# Patient Record
Sex: Male | Born: 1960 | Race: White | Hispanic: No | Marital: Married | Smoking: Current every day smoker
Health system: Southern US, Community
[De-identification: ages and names within clinical notes are randomized; demographics above are authoritative.]

## PROBLEM LIST (undated history)

## (undated) DIAGNOSIS — Z973 Presence of spectacles and contact lenses: Secondary | ICD-10-CM

## (undated) DIAGNOSIS — M7541 Impingement syndrome of right shoulder: Secondary | ICD-10-CM

## (undated) DIAGNOSIS — E119 Type 2 diabetes mellitus without complications: Secondary | ICD-10-CM

## (undated) DIAGNOSIS — M7501 Adhesive capsulitis of right shoulder: Secondary | ICD-10-CM

## (undated) DIAGNOSIS — E785 Hyperlipidemia, unspecified: Secondary | ICD-10-CM

## (undated) HISTORY — PX: KNEE ARTHROSCOPY: SUR90

---

## 2001-06-07 ENCOUNTER — Emergency Department (HOSPITAL_COMMUNITY): Admission: EM | Admit: 2001-06-07 | Discharge: 2001-06-07 | Payer: Self-pay | Admitting: *Deleted

## 2004-02-16 ENCOUNTER — Emergency Department (HOSPITAL_COMMUNITY): Admission: EM | Admit: 2004-02-16 | Discharge: 2004-02-17 | Payer: Self-pay | Admitting: Emergency Medicine

## 2005-04-15 HISTORY — PX: HERNIA REPAIR: SHX51

## 2005-04-15 HISTORY — PX: CHOLECYSTECTOMY: SHX55

## 2014-03-25 ENCOUNTER — Other Ambulatory Visit (HOSPITAL_COMMUNITY): Payer: Self-pay | Admitting: Family Medicine

## 2014-03-25 ENCOUNTER — Ambulatory Visit (HOSPITAL_COMMUNITY)
Admission: RE | Admit: 2014-03-25 | Discharge: 2014-03-25 | Disposition: A | Discharge status: Home / Self Care | Payer: BC Managed Care – PPO | Source: Ambulatory Visit | Attending: Family Medicine | Admitting: Family Medicine

## 2014-03-25 DIAGNOSIS — M25511 Pain in right shoulder: Secondary | ICD-10-CM | POA: Insufficient documentation

## 2014-05-24 ENCOUNTER — Encounter (HOSPITAL_BASED_OUTPATIENT_CLINIC_OR_DEPARTMENT_OTHER): Payer: Self-pay | Admitting: *Deleted

## 2014-05-24 NOTE — Progress Notes (Signed)
He will go to [facility] for [facility]-caring for wife with lymphoma Mom to bring him dos

## 2014-05-26 ENCOUNTER — Encounter (HOSPITAL_COMMUNITY)
Admission: RE | Admit: 2014-05-26 | Discharge: 2014-05-26 | Disposition: A | Discharge status: Home / Self Care | Payer: BLUE CROSS/BLUE SHIELD | Source: Ambulatory Visit | Attending: Orthopedic Surgery | Admitting: Orthopedic Surgery

## 2014-05-26 DIAGNOSIS — F17210 Nicotine dependence, cigarettes, uncomplicated: Secondary | ICD-10-CM | POA: Diagnosis not present

## 2014-05-26 DIAGNOSIS — E785 Hyperlipidemia, unspecified: Secondary | ICD-10-CM | POA: Diagnosis not present

## 2014-05-26 DIAGNOSIS — S43401A Unspecified sprain of right shoulder joint, initial encounter: Secondary | ICD-10-CM | POA: Diagnosis not present

## 2014-05-26 DIAGNOSIS — E119 Type 2 diabetes mellitus without complications: Secondary | ICD-10-CM | POA: Diagnosis not present

## 2014-05-26 DIAGNOSIS — M7541 Impingement syndrome of right shoulder: Secondary | ICD-10-CM | POA: Diagnosis present

## 2014-05-26 LAB — BASIC METABOLIC PANEL
ANION GAP: 8 (ref 5–15)
BUN: 10 mg/dL (ref 6–23)
CALCIUM: 9 mg/dL (ref 8.4–10.5)
CO2: 27 mmol/L (ref 19–32)
CREATININE: 0.72 mg/dL (ref 0.50–1.35)
Chloride: 103 mmol/L (ref 96–112)
Glucose, Bld: 138 mg/dL — ABNORMAL HIGH (ref 70–99)
Potassium: 3.7 mmol/L (ref 3.5–5.1)
SODIUM: 138 mmol/L (ref 135–145)

## 2014-05-27 ENCOUNTER — Encounter (HOSPITAL_BASED_OUTPATIENT_CLINIC_OR_DEPARTMENT_OTHER): Payer: Self-pay | Admitting: *Deleted

## 2014-05-27 ENCOUNTER — Ambulatory Visit (HOSPITAL_BASED_OUTPATIENT_CLINIC_OR_DEPARTMENT_OTHER): Payer: BLUE CROSS/BLUE SHIELD | Admitting: Anesthesiology

## 2014-05-27 ENCOUNTER — Ambulatory Visit (HOSPITAL_BASED_OUTPATIENT_CLINIC_OR_DEPARTMENT_OTHER)
Admission: RE | Admit: 2014-05-27 | Discharge: 2014-05-27 | Disposition: A | Discharge status: Home / Self Care | Payer: BLUE CROSS/BLUE SHIELD | Source: Ambulatory Visit | Attending: Orthopedic Surgery | Admitting: Orthopedic Surgery

## 2014-05-27 ENCOUNTER — Encounter (HOSPITAL_BASED_OUTPATIENT_CLINIC_OR_DEPARTMENT_OTHER)
Admission: RE | Disposition: A | Discharge status: Home / Self Care | Payer: Self-pay | Source: Ambulatory Visit | Attending: Orthopedic Surgery

## 2014-05-27 DIAGNOSIS — S43401A Unspecified sprain of right shoulder joint, initial encounter: Secondary | ICD-10-CM | POA: Diagnosis not present

## 2014-05-27 DIAGNOSIS — E785 Hyperlipidemia, unspecified: Secondary | ICD-10-CM | POA: Insufficient documentation

## 2014-05-27 DIAGNOSIS — M7541 Impingement syndrome of right shoulder: Secondary | ICD-10-CM | POA: Insufficient documentation

## 2014-05-27 DIAGNOSIS — E119 Type 2 diabetes mellitus without complications: Secondary | ICD-10-CM | POA: Insufficient documentation

## 2014-05-27 DIAGNOSIS — F17210 Nicotine dependence, cigarettes, uncomplicated: Secondary | ICD-10-CM | POA: Insufficient documentation

## 2014-05-27 DIAGNOSIS — M7501 Adhesive capsulitis of right shoulder: Secondary | ICD-10-CM

## 2014-05-27 HISTORY — DX: Type 2 diabetes mellitus without complications: E11.9

## 2014-05-27 HISTORY — DX: Impingement syndrome of right shoulder: M75.41

## 2014-05-27 HISTORY — DX: Hyperlipidemia, unspecified: E78.5

## 2014-05-27 HISTORY — DX: Presence of spectacles and contact lenses: Z97.3

## 2014-05-27 HISTORY — PX: SHOULDER ARTHROSCOPY WITH SUBACROMIAL DECOMPRESSION: SHX5684

## 2014-05-27 HISTORY — DX: Adhesive capsulitis of right shoulder: M75.01

## 2014-05-27 LAB — GLUCOSE, CAPILLARY
GLUCOSE-CAPILLARY: 153 mg/dL — AB (ref 70–99)
Glucose-Capillary: 119 mg/dL — ABNORMAL HIGH (ref 70–99)

## 2014-05-27 LAB — POCT HEMOGLOBIN-HEMACUE: HEMOGLOBIN: 15.7 g/dL (ref 13.0–17.0)

## 2014-05-27 SURGERY — SHOULDER ARTHROSCOPY WITH SUBACROMIAL DECOMPRESSION
Anesthesia: General | Site: Shoulder | Laterality: Right

## 2014-05-27 MED ORDER — SUCCINYLCHOLINE CHLORIDE 20 MG/ML IJ SOLN
INTRAMUSCULAR | Status: DC | PRN
  Administered 2014-05-27: 50 mg via INTRAVENOUS

## 2014-05-27 MED ORDER — PROPOFOL 10 MG/ML IV BOLUS
INTRAVENOUS | Status: DC | PRN
  Administered 2014-05-27: 200 mg via INTRAVENOUS

## 2014-05-27 MED ORDER — OXYCODONE HCL 5 MG PO TABS: 5.0000 mg | ORAL_TABLET | Freq: Once | ORAL | Status: DC | PRN

## 2014-05-27 MED ORDER — BUPIVACAINE-EPINEPHRINE (PF) 0.5% -1:200000 IJ SOLN
INTRAMUSCULAR | Status: DC | PRN
  Administered 2014-05-27: 25 mL via PERINEURAL

## 2014-05-27 MED ORDER — BUPIVACAINE HCL (PF) 0.5 % IJ SOLN
INTRAMUSCULAR | Status: AC
  Filled 2014-05-27: qty 30

## 2014-05-27 MED ORDER — FENTANYL CITRATE 0.05 MG/ML IJ SOLN
INTRAMUSCULAR | Status: AC
  Filled 2014-05-27: qty 2

## 2014-05-27 MED ORDER — CEFAZOLIN SODIUM-DEXTROSE 2-3 GM-% IV SOLR
INTRAVENOUS | Status: AC
  Filled 2014-05-27: qty 50

## 2014-05-27 MED ORDER — BACLOFEN 10 MG PO TABS: 10.0000 mg | ORAL_TABLET | Freq: Three times a day (TID) | ORAL | Status: DC

## 2014-05-27 MED ORDER — CEFAZOLIN SODIUM-DEXTROSE 2-3 GM-% IV SOLR
INTRAVENOUS | Status: DC | PRN
  Administered 2014-05-27: 2 g via INTRAVENOUS

## 2014-05-27 MED ORDER — HYDROMORPHONE HCL 1 MG/ML IJ SOLN: 0.2500 mg | INTRAMUSCULAR | Status: DC | PRN

## 2014-05-27 MED ORDER — MIDAZOLAM HCL 2 MG/2ML IJ SOLN
INTRAMUSCULAR | Status: AC
  Filled 2014-05-27: qty 2

## 2014-05-27 MED ORDER — DEXAMETHASONE SODIUM PHOSPHATE 4 MG/ML IJ SOLN
INTRAMUSCULAR | Status: DC | PRN
  Administered 2014-05-27: 10 mg via INTRAVENOUS

## 2014-05-27 MED ORDER — BUPIVACAINE-EPINEPHRINE (PF) 0.5% -1:200000 IJ SOLN
INTRAMUSCULAR | Status: AC
  Filled 2014-05-27: qty 30

## 2014-05-27 MED ORDER — FENTANYL CITRATE 0.05 MG/ML IJ SOLN
INTRAMUSCULAR | Status: DC | PRN
  Administered 2014-05-27 (×2): 50 ug via INTRAVENOUS

## 2014-05-27 MED ORDER — LACTATED RINGERS IV SOLN
INTRAVENOUS | Status: DC
  Administered 2014-05-27 (×2): via INTRAVENOUS

## 2014-05-27 MED ORDER — SODIUM CHLORIDE 0.9 % IR SOLN
Status: DC | PRN
  Administered 2014-05-27: 4000 mL

## 2014-05-27 MED ORDER — OXYCODONE HCL 5 MG/5ML PO SOLN: 5.0000 mg | Freq: Once | ORAL | Status: DC | PRN

## 2014-05-27 MED ORDER — FENTANYL CITRATE 0.05 MG/ML IJ SOLN
50.0000 ug | INTRAMUSCULAR | Status: DC | PRN
  Administered 2014-05-27: 100 ug via INTRAVENOUS

## 2014-05-27 MED ORDER — CEFAZOLIN SODIUM-DEXTROSE 2-3 GM-% IV SOLR: 2.0000 g | INTRAVENOUS | Status: DC

## 2014-05-27 MED ORDER — LIDOCAINE HCL (CARDIAC) 20 MG/ML IV SOLN
INTRAVENOUS | Status: DC | PRN
  Administered 2014-05-27: 50 mg via INTRAVENOUS

## 2014-05-27 MED ORDER — SENNA-DOCUSATE SODIUM 8.6-50 MG PO TABS: 2.0000 | ORAL_TABLET | Freq: Every day | ORAL | Status: DC

## 2014-05-27 MED ORDER — MIDAZOLAM HCL 2 MG/2ML IJ SOLN
1.0000 mg | INTRAMUSCULAR | Status: DC | PRN
  Administered 2014-05-27: 2 mg via INTRAVENOUS

## 2014-05-27 MED ORDER — OXYCODONE-ACETAMINOPHEN 10-325 MG PO TABS: 1.0000 | ORAL_TABLET | Freq: Four times a day (QID) | ORAL | Status: DC | PRN

## 2014-05-27 MED ORDER — ONDANSETRON HCL 4 MG/2ML IJ SOLN: 4.0000 mg | Freq: Once | INTRAMUSCULAR | Status: DC | PRN

## 2014-05-27 MED ORDER — ONDANSETRON HCL 4 MG PO TABS: 4.0000 mg | ORAL_TABLET | Freq: Three times a day (TID) | ORAL | Status: DC | PRN

## 2014-05-27 SURGICAL SUPPLY — 66 items
BLADE CUTTER GATOR 3.5 (BLADE) ×3 IMPLANT
BLADE GREAT WHITE 4.2 (BLADE) IMPLANT
BLADE GREAT WHITE 4.2MM (BLADE)
BLADE SURG 15 STRL LF DISP TIS (BLADE) IMPLANT
BLADE SURG 15 STRL SS (BLADE)
BUR OVAL 6.0 (BURR) ×3 IMPLANT
CANNULA 5.75X71 LONG (CANNULA) ×3 IMPLANT
CANNULA TWIST IN 8.25X7CM (CANNULA) IMPLANT
CLOSURE STERI-STRIP 1/2X4 (GAUZE/BANDAGES/DRESSINGS) ×1
CLSR STERI-STRIP ANTIMIC 1/2X4 (GAUZE/BANDAGES/DRESSINGS) ×2 IMPLANT
DECANTER SPIKE VIAL GLASS SM (MISCELLANEOUS) IMPLANT
DRAPE INCISE IOBAN 66X45 STRL (DRAPES) ×3 IMPLANT
DRAPE SHOULDER BEACH CHAIR (DRAPES) ×3 IMPLANT
DRAPE U 20/CS (DRAPES) ×3 IMPLANT
DRAPE U-SHAPE 47X51 STRL (DRAPES) ×3 IMPLANT
DRSG PAD ABDOMINAL 8X10 ST (GAUZE/BANDAGES/DRESSINGS) ×3 IMPLANT
DURAPREP 26ML APPLICATOR (WOUND CARE) ×3 IMPLANT
ELECT REM PT RETURN 9FT ADLT (ELECTROSURGICAL)
ELECTRODE REM PT RTRN 9FT ADLT (ELECTROSURGICAL) IMPLANT
FIBERSTICK 2 (SUTURE) IMPLANT
GAUZE SPONGE 4X4 12PLY STRL (GAUZE/BANDAGES/DRESSINGS) ×3 IMPLANT
GLOVE BIO SURGEON STRL SZ 6.5 (GLOVE) ×2 IMPLANT
GLOVE BIO SURGEON STRL SZ8 (GLOVE) ×3 IMPLANT
GLOVE BIO SURGEONS STRL SZ 6.5 (GLOVE) ×1
GLOVE BIOGEL PI IND STRL 7.0 (GLOVE) ×3 IMPLANT
GLOVE BIOGEL PI IND STRL 8 (GLOVE) ×2 IMPLANT
GLOVE BIOGEL PI INDICATOR 7.0 (GLOVE) ×6
GLOVE BIOGEL PI INDICATOR 8 (GLOVE) ×4
GLOVE ECLIPSE 6.5 STRL STRAW (GLOVE) ×3 IMPLANT
GLOVE ORTHO TXT STRL SZ7.5 (GLOVE) ×3 IMPLANT
GOWN STRL REUS W/ TWL LRG LVL3 (GOWN DISPOSABLE) ×2 IMPLANT
GOWN STRL REUS W/ TWL XL LVL3 (GOWN DISPOSABLE) ×1 IMPLANT
GOWN STRL REUS W/TWL LRG LVL3 (GOWN DISPOSABLE) ×6
GOWN STRL REUS W/TWL XL LVL3 (GOWN DISPOSABLE) ×3
IMMOBILIZER SHOULDER FOAM XLGE (SOFTGOODS) IMPLANT
KIT SHOULDER TRACTION (DRAPES) ×3 IMPLANT
LASSO 90 CVE QUICKPAS (DISPOSABLE) IMPLANT
MANIFOLD NEPTUNE II (INSTRUMENTS) ×3 IMPLANT
NEEDLE SCORPION MULTI FIRE (NEEDLE) ×3 IMPLANT
PACK ARTHROSCOPY DSU (CUSTOM PROCEDURE TRAY) ×3 IMPLANT
PACK BASIN DAY SURGERY FS (CUSTOM PROCEDURE TRAY) ×3 IMPLANT
SET ARTHROSCOPY TUBING (MISCELLANEOUS) ×3
SET ARTHROSCOPY TUBING LN (MISCELLANEOUS) ×1 IMPLANT
SHEET MEDIUM DRAPE 40X70 STRL (DRAPES) ×3 IMPLANT
SLEEVE SCD COMPRESS KNEE MED (MISCELLANEOUS) ×3 IMPLANT
SLING ARM IMMOBILIZER LRG (SOFTGOODS) IMPLANT
SLING ARM IMMOBILIZER MED (SOFTGOODS) IMPLANT
SLING ARM LRG ADULT FOAM STRAP (SOFTGOODS) ×3 IMPLANT
SLING ARM MED ADULT FOAM STRAP (SOFTGOODS) IMPLANT
SLING ARM XL FOAM STRAP (SOFTGOODS) IMPLANT
SUT FIBERWIRE #2 38 T-5 BLUE (SUTURE)
SUT MNCRL AB 4-0 PS2 18 (SUTURE) ×3 IMPLANT
SUT PDS AB 0 CT 36 (SUTURE) IMPLANT
SUT PDS AB 1 CT  36 (SUTURE)
SUT PDS AB 1 CT 36 (SUTURE) IMPLANT
SUT TIGER TAPE 7 IN WHITE (SUTURE) IMPLANT
SUT VIC AB 3-0 SH 27 (SUTURE)
SUT VIC AB 3-0 SH 27X BRD (SUTURE) IMPLANT
SUTURE FIBERWR #2 38 T-5 BLUE (SUTURE) IMPLANT
TAPE FIBER 2MM 7IN #2 BLUE (SUTURE) IMPLANT
TOWEL OR 17X24 6PK STRL BLUE (TOWEL DISPOSABLE) ×3 IMPLANT
TOWEL OR NON WOVEN STRL DISP B (DISPOSABLE) ×3 IMPLANT
TUBE CONNECTING 20'X1/4 (TUBING)
TUBE CONNECTING 20X1/4 (TUBING) IMPLANT
WAND STAR VAC 90 (SURGICAL WAND) ×3 IMPLANT
WATER STERILE IRR 1000ML POUR (IV SOLUTION) ×3 IMPLANT

## 2014-05-27 NOTE — H&P (Signed)
PREOPERATIVE H&P  Chief Complaint: other acticular cartilage disorders right shoulder impingement syndrome of right shoulder strain of muscle and tendon of the rotator cuff of right shoulder   HPI: Peter Simmons is a 54 y.o. male who presents for preoperative history and physical with a diagnosis of other acticular cartilage disorders right shoulder impingement syndrome of right shoulder strain of muscle and tendon of the rotator cuff of right shoulder . Symptoms are rated as moderate to severe, and have been worsening.  This is significantly impairing activities of daily living.  He has elected for surgical management. Failed injections and exercises and NSAIDS  Past Medical History  Diagnosis Date  . Diabetes mellitus without complication   . Hyperlipemia   . Wears glasses    Past Surgical History  Procedure Laterality Date  . Knee arthroscopy      rightx2  . Knee arthroscopy      left x1  . Cholecystectomy  2007    hernia fixed also  . Hernia repair  2007    done with lap choli   History   Social History  . Marital Status: Married    Spouse Name: N/A  . Number of Children: N/A  . Years of Education: N/A   Social History Main Topics  . Smoking status: Current Every Day Smoker -- 0.50 packs/day  . Smokeless tobacco: Not on file  . Alcohol Use: No  . Drug Use: No  . Sexual Activity: Not on file   Other Topics Concern  . None   Social History Narrative   History reviewed. No pertinent family history. No Known Allergies Prior to Admission medications   Medication Sig Start Date End Date Taking? Authorizing Provider  simvastatin (ZOCOR) 20 MG tablet Take 20 mg by mouth daily.   Yes Historical Provider, MD  SitaGLIPtin-MetFORMIN HCl 50-1000 MG TB24 Take by mouth.   Yes Historical Provider, MD     Positive ROS: All other systems have been reviewed and were otherwise negative with the exception of those mentioned in the HPI and as above.  Physical Exam: General:  Alert, no acute distress Cardiovascular: No pedal edema Respiratory: No cyanosis, no use of accessory musculature GI: No organomegaly, abdomen is soft and non-tender Skin: No lesions in the area of chief complaint Neurologic: Sensation intact distally Psychiatric: Patient is competent for consent with normal mood and affect Lymphatic: No axillary or cervical lymphadenopathy  MUSCULOSKELETAL: right shoulder positive impingement signs, 0-160 degrees of motion, cuff strength fair  Assessment: other acticular cartilage disorders right shoulder impingement syndrome of right shoulder strain of muscle and tendon of the rotator cuff of right shoulder   Plan: Plan for Procedure(s): RIGHT SHOULDER ARTHROSCOPY DEBRIDEMENT ACROMIOPLASTY POSSIBLE ROTATOR CUFF REPAIR  The risks benefits and alternatives were discussed with the patient including but not limited to the risks of nonoperative treatment, versus surgical intervention including infection, bleeding, nerve injury,  blood clots, cardiopulmonary complications, morbidity, mortality, among others, and they were willing to proceed.   Eulas PostLANDAU,Tieshia Rettinger P, MD Cell [phone]   05/27/2014 1:35 PM

## 2014-05-27 NOTE — Transfer of Care (Signed)
Immediate Anesthesia Transfer of Care Note  Patient: Peter Simmons  Procedure(s) Performed: Procedure(s): RIGHT SHOULDER ARTHROSCOPY WITH DEBRIDEMENT, ACROMIOPLASTY  (Right)  Patient Location: PACU  Anesthesia Type:GA combined with regional for post-op pain  Level of Consciousness: sedated and patient cooperative  Airway & Oxygen Therapy: Patient Spontanous Breathing and Patient connected to face mask oxygen  Post-op Assessment: Report given to RN and Post -op Vital signs reviewed and stable  Post vital signs: Reviewed and stable  Last Vitals:  Filed Vitals:   05/27/14 1135  BP: 113/66  Pulse: 82  Temp:   Resp: 14    Complications: No apparent anesthesia complications

## 2014-05-27 NOTE — Op Note (Signed)
05/27/2014  2:30 PM  PATIENT:  Peter Simmons    PRE-OPERATIVE DIAGNOSIS:  Right shoulder impingement syndrome, possible rotator cuff tear versus biceps tendinosis  POST-OPERATIVE DIAGNOSIS:  Right shoulder impingement syndrome with superior labral fraying, mild adhesive capsulitis   PROCEDURE:  Right shoulder arthroscopy with debridement of labrum, acromioplasty, and manipulation under anesthesia  SURGEON:  Eulas PostLANDAU,Meggin Ola P, MD  PHYSICIAN ASSISTANT: None  ANESTHESIA:   General  PREOPERATIVE INDICATIONS:  Peter FlattenBilly R Tolbert is a  54 y.o. male who had about 2 years worth of right shoulder pain, who failed conservative measures and elected for surgical management.    The risks benefits and alternatives were discussed with the patient preoperatively including but not limited to the risks of infection, bleeding, nerve injury, cardiopulmonary complications, the need for revision surgery, among others, and the patient was willing to proceed.  OPERATIVE IMPLANTS: None  OPERATIVE FINDINGS: The shoulder lacked about 20 of motion and overhead forward flexion during examination under anesthesia and manipulation actually yielded substantial lysis of adhesions. The capsule from the articular side however did not have significant injection or redness. The biceps tendon was totally pristine, the superior labrum did have some degenerative fraying, the glenoid and humeral head articular cartilage was in good condition. The subscapularis was pristine. The middle glenohumeral ligament did have some fraying which I debrided. The labrum was intact posteriorly and anteroinferiorly.  From the bursal side there was moderate CA ligament fraying, the tendons of the supraspinatus were all in good condition, although there was some slight fraying but nothing terrible. There was moderate subacromial spurring. The before meals joint was pristine and was not violated.  OPERATIVE PROCEDURE: The patient was brought to the  operating room and placed in the supine position. Gen. anesthesia was administered. IV antibiotics were given. The right upper extremity was examined, and did find that there was some stiffness, and manipulation under anesthesia did yield significant lysis of adhesions.  The right upper extremity was then prepped and draped in usual sterile fashion and he was in a semilateral decubitus position with all bony prominences padded. Time out was performed. Diagnostic arthroscopy was carried out with the above-named findings and I used the arthroscopic shaver to debride the labrum, as well as a portion of the middle glenohumeral ligament. The biceps was evaluated and found to be normal and so I did not perform a tenotomy.  I then went to the subacromial space and performed a complete bursectomy, followed by CA ligament release and acromioplasty. I confirmed acromioplasty as well as integrity of the supra spinatus tendon from multiple portals.  The instruments were removed, the portals closed with Monocryl followed by Steri-Strips and sterile gauze. He was awakened and returned to the PACU in stable and satisfactory condition. There were no complications and he tolerated the procedure well.

## 2014-05-27 NOTE — Anesthesia Procedure Notes (Addendum)
Anesthesia Regional Block:  Interscalene brachial plexus block  Pre-Anesthetic Checklist: ,, timeout performed, Correct Patient, Correct Site, Correct Laterality, Correct Procedure, Correct Position, site marked, Risks and benefits discussed,  Surgical consent,  Pre-op evaluation,  At surgeon's request and post-op pain management  Laterality: Right and Upper  Prep: chloraprep       Needles:  Injection technique: Single-shot  Needle Type: Echogenic Needle     Needle Length: 5cm 5 cm Needle Gauge: 21 and 21 G    Additional Needles:  Procedures: ultrasound guided (picture in chart) Interscalene brachial plexus block Narrative:  Start time: 05/27/2014 11:34 AM End time: 05/27/2014 11:39 AM Injection made incrementally with aspirations every 5 mL.  Performed by: Personally  Anesthesiologist: CREWS, DAVID A   Procedure Name: Intubation Date/Time: 05/27/2014 1:37 PM Performed by: Harriman DesanctisLINKA, Treniece Holsclaw L Pre-anesthesia Checklist: Patient identified, Emergency Drugs available, Suction available, Patient being monitored and Timeout performed Patient Re-evaluated:Patient Re-evaluated prior to inductionOxygen Delivery Method: Circle System Utilized Preoxygenation: Pre-oxygenation with 100% oxygen Intubation Type: IV induction Ventilation: Mask ventilation without difficulty Laryngoscope Size: Miller and 3 Grade View: Grade III Tube type: Oral Tube size: 8.0 mm Number of attempts: 1 Airway Equipment and Method: Stylet and Oral airway Placement Confirmation: ETT inserted through vocal cords under direct vision,  positive ETCO2 and breath sounds checked- equal and bilateral Secured at: 21 cm Tube secured with: Tape Dental Injury: Teeth and Oropharynx as per pre-operative assessment

## 2014-05-27 NOTE — Progress Notes (Signed)
Assisted Dr. Crews with right, ultrasound guided, interscalene  block. Side rails up, monitors on throughout procedure. See vital signs in flow sheet. Tolerated Procedure well. 

## 2014-05-27 NOTE — Anesthesia Preprocedure Evaluation (Signed)
Anesthesia Evaluation  Patient identified by MRN, date of birth, ID band Patient awake    Reviewed: Allergy & Precautions, NPO status , Patient's Chart, lab work & pertinent test results  Airway Mallampati: I  TM Distance: >3 FB Neck ROM: Full    Dental  (+) Teeth Intact, Dental Advisory Given   Pulmonary Current Smoker,  breath sounds clear to auscultation        Cardiovascular Rhythm:Regular Rate:Normal     Neuro/Psych    GI/Hepatic   Endo/Other  diabetes, Well Controlled, Type 2, Oral Hypoglycemic Agents  Renal/GU      Musculoskeletal   Abdominal   Peds  Hematology   Anesthesia Other Findings   Reproductive/Obstetrics                             Anesthesia Physical Anesthesia Plan  ASA: II  Anesthesia Plan: General   Post-op Pain Management: MAC Combined w/ Regional for Post-op pain   Induction: Intravenous  Airway Management Planned: Oral ETT  Additional Equipment:   Intra-op Plan:   Post-operative Plan: Extubation in OR  Informed Consent: I have reviewed the patients History and Physical, chart, labs and discussed the procedure including the risks, benefits and alternatives for the proposed anesthesia with the patient or authorized representative who has indicated his/her understanding and acceptance.   Dental advisory given  Plan Discussed with: CRNA, Anesthesiologist and Surgeon  Anesthesia Plan Comments:         Anesthesia Quick Evaluation

## 2014-05-27 NOTE — Anesthesia Postprocedure Evaluation (Signed)
Anesthesia Post Note  Patient: Peter Simmons  Procedure(s) Performed: Procedure(s) (LRB): RIGHT SHOULDER ARTHROSCOPY WITH DEBRIDEMENT, ACROMIOPLASTY  (Right)  Anesthesia type: general  Patient location: PACU  Post pain: Pain level controlled  Post assessment: Patient's Cardiovascular Status Stable  Last Vitals:  Filed Vitals:   05/27/14 1555  BP: 112/67  Pulse: 66  Temp: 36.6 C  Resp: 18    Post vital signs: Reviewed and stable  Level of consciousness: sedated  Complications: No apparent anesthesia complications

## 2014-05-27 NOTE — Discharge Instructions (Signed)
Diet: As you were doing prior to hospitalization   Shower:  May shower but keep the wounds dry, use an occlusive plastic wrap, NO SOAKING IN TUB.  If the bandage gets wet, change with a clean dry gauze.  Dressing:  You may change your dressing 3-5 days after surgery.  Then change the dressing daily with sterile gauze dressing.    There are sticky tapes (steri-strips) on your wounds and all the stitches are absorbable.  Leave the steri-strips in place when changing your dressings, they will peel off with time, usually 2-3 weeks.  Activity:  Increase activity slowly as tolerated, but follow the weight bearing instructions below.  No lifting or driving for 6 weeks.  Weight Bearing:   Ok to use arm and remove sling when comfortable.    To prevent constipation: you may use a stool softener such as -  Colace (over the counter) 100 mg by mouth twice a day  Drink plenty of fluids (prune juice may be helpful) and high fiber foods Miralax (over the counter) for constipation as needed.    Itching:  If you experience itching with your medications, try taking only a single pain pill, or even half a pain pill at a time.  You may take up to 10 pain pills per day, and you can also use benadryl over the counter for itching or also to help with sleep.   Precautions:  If you experience chest pain or shortness of breath - call 911 immediately for transfer to the hospital emergency department!!  If you develop a fever greater that 101 F, purulent drainage from wound, increased redness or drainage from wound, or calf pain -- Call the office at [phone]                                                Follow- Up Appointment:  Please call for an appointment to be [place] 2 [place] [phone]      Post Anesthesia Home Care Instructions  Activity: Get plenty of rest for the remainder of the day. A responsible adult should stay with you for 24 hours following the procedure.  For the next 24  hours, DO NOT: -Drive a car -Advertising copywriterperate machinery -Drink alcoholic beverages -Take any medication unless instructed by your physician -Make any legal decisions or sign important papers.  Meals: Start with liquid foods such as gelatin or soup. Progress to regular foods as tolerated. Avoid greasy, spicy, heavy foods. If nausea and/or vomiting occur, drink only clear liquids until the nausea and/or vomiting subsides. Call your physician if vomiting continues.  Special Instructions/Symptoms: Your throat may feel dry or sore from the anesthesia or the breathing tube placed in your throat during surgery. If this causes discomfort, gargle with warm salt water. The discomfort should disappear within 24 hours.   Regional Anesthesia Blocks  1. Numbness or the inability to move the "blocked" extremity may last from 3-48 hours after placement. The length of time depends on the medication injected and your individual response to the medication. If the numbness is not going away after 48 hours, call your surgeon.  2. The extremity that is blocked will need to be protected until the numbness is gone and the  Strength has returned. Because you cannot feel it, you will need to take extra care to avoid injury. Because it  may be weak, you may have difficulty moving it or using it. You may not know what position it is in without looking at it while the block is in effect.  3. For blocks in the legs and feet, returning to weight bearing and walking needs to be done carefully. You will need to wait until the numbness is entirely gone and the strength has returned. You should be able to move your leg and foot normally before you try and bear weight or walk. You will need someone to be with you when you first try to ensure you do not fall and possibly risk injury.  4. Bruising and tenderness at the needle site are common side effects and will resolve in a few days.  5. Persistent numbness or new problems with movement  should be communicated to the surgeon or the [facility] [phone] [facility] [phone]).

## 2014-05-31 ENCOUNTER — Encounter (HOSPITAL_BASED_OUTPATIENT_CLINIC_OR_DEPARTMENT_OTHER): Payer: Self-pay | Admitting: Orthopedic Surgery

## 2015-01-20 ENCOUNTER — Encounter: Payer: Self-pay | Admitting: "Endocrinology

## 2015-01-20 ENCOUNTER — Ambulatory Visit (INDEPENDENT_AMBULATORY_CARE_PROVIDER_SITE_OTHER): Payer: BLUE CROSS/BLUE SHIELD | Admitting: "Endocrinology

## 2015-01-20 VITALS — BP 125/75 | HR 75 | Ht 69.0 in | Wt 170.0 lb

## 2015-01-20 DIAGNOSIS — E1165 Type 2 diabetes mellitus with hyperglycemia: Secondary | ICD-10-CM | POA: Diagnosis not present

## 2015-01-20 DIAGNOSIS — Z716 Tobacco abuse counseling: Secondary | ICD-10-CM | POA: Diagnosis not present

## 2015-01-20 DIAGNOSIS — E785 Hyperlipidemia, unspecified: Secondary | ICD-10-CM | POA: Insufficient documentation

## 2015-01-20 DIAGNOSIS — Z794 Long term (current) use of insulin: Secondary | ICD-10-CM

## 2015-01-20 DIAGNOSIS — IMO0001 Reserved for inherently not codable concepts without codable children: Secondary | ICD-10-CM

## 2015-01-20 NOTE — Progress Notes (Signed)
Subjective:    Patient ID: Peter Simmons, male    DOB: 1961-01-24,    Past Medical History  Diagnosis Date  . Diabetes mellitus without complication (HCC)   . Hyperlipemia   . Wears glasses   . Impingement syndrome of right shoulder 05/27/2014  . 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 Knee arthroscopy      rightx2  . Knee arthroscopy      left x1  . Cholecystectomy  2007    hernia fixed also  . Hernia repair  2007    done with lap choli  . Shoulder arthroscopy with subacromial decompression Right 05/27/2014    Procedure: RIGHT SHOULDER ARTHROSCOPY WITH DEBRIDEMENT, ACROMIOPLASTY ;  Surgeon: Eulas Post, MD;  Location: [facility];  Service: Orthopedics;  Laterality: Right;   Social History   Social History  . Marital Status: Married    Spouse Name: N/A  . Number of Children: N/A  . Years of Education: N/A   Social History Main Topics  . Smoking status: Current Every Day Smoker -- 0.50 packs/day  . Smokeless tobacco: None  . Alcohol Use: No  . Drug Use: No  . Sexual Activity: Not Asked   Other Topics Concern  . None   Social History Narrative   Outpatient Encounter Prescriptions as of 01/20/2015  Medication Sig  . baclofen (LIORESAL) 10 MG tablet Take 1 tablet (10 mg total) by mouth 3 (three) times daily. As needed for muscle spasm  . ondansetron (ZOFRAN) 4 MG tablet Take 1 tablet (4 mg total) by mouth every 8 (eight) hours as needed for nausea or vomiting.  Marland Kitchen oxyCODONE-acetaminophen (PERCOCET) 10-325 MG per tablet Take 1-2 tablets by mouth every 6 (six) hours as needed for pain. MAXIMUM TOTAL ACETAMINOPHEN DOSE IS 4000 MG PER DAY  . sennosides-docusate sodium (SENOKOT-S) 8.6-50 MG tablet Take 2 tablets by mouth daily.  . simvastatin (ZOCOR) 20 MG tablet Take 20 mg by mouth daily.  . SitaGLIPtin-MetFORMIN HCl 50-1000 MG TB24 Take by mouth.   No facility-administered encounter  medications on file as of 01/20/2015.   ALLERGIES: No Known Allergies VACCINATION STATUS:  There is no immunization history on file for this patient.  HPI  54- yr- old male patient with medical problems as follows. Patient is here to f/u for uncontrolled type 2 DM. Pt is on basal / bolus insulin and Janumet 50/1000mg  ER once a day. He came with improved A1c of 7.3% from  12.5% . He feels stronger, he documented near target glucose profile. He didn't have to inject NovoLog at least a third of the times.  He denies polyuria, polydipsia.  Patient was diagnosed with type 2 DM approx. 9 yrs ago, likely due to ETOH abuse. Has been overweight several years ago, and has abused ETOH. Patient denies history of CAD, CVA, CKD, Neuropathy, and Retinopathy. Patient is an active smoker, does not participate in a regular exercise program. Patient is willing to remain committed for change in life style.  Review of Systems  Constitutional: Negative for fatigue and unexpected weight change.  HENT: Negative for dental problem, mouth sores and trouble swallowing.   Eyes: Negative for visual disturbance.  Respiratory: Negative for cough, choking, chest tightness, shortness of breath and wheezing.   Cardiovascular: Negative for chest pain, palpitations and leg swelling.  Gastrointestinal: Negative for nausea, vomiting, abdominal pain, diarrhea, constipation and abdominal distention.  Endocrine: Negative for polydipsia,  polyphagia and polyuria.  Genitourinary: Negative for dysuria, urgency, hematuria and flank pain.  Musculoskeletal: Negative for myalgias, back pain, gait problem and neck pain.  Skin: Negative for pallor, rash and wound.  Neurological: Negative for seizures, syncope, weakness, numbness and headaches.  Psychiatric/Behavioral: Negative.  Negative for confusion and dysphoric mood.    Objective:    BP 125/75 mmHg  Pulse 75  Ht  (1.753 m)  Wt 170 lb (77.111 kg)  BMI 25.09 kg/m2  SpO2  98%  Wt Readings from Last 3 Encounters:  01/20/15 170 lb (77.111 kg)  05/27/14 164 lb (74.39 kg)    Physical Exam  Constitutional: He is oriented to person, place, and time. He appears well-developed and well-nourished. He is cooperative. No distress.  HENT:  Head: Normocephalic and atraumatic.  Eyes: EOM are normal.  Neck: Normal range of motion. Neck supple. No tracheal deviation present. No thyromegaly present.  Cardiovascular: Normal rate, S1 normal, S2 normal and normal heart sounds.  Exam reveals no gallop.   No murmur heard. Pulses:      Dorsalis pedis pulses are 1+ on the right side, and 1+ on the left side.       Posterior tibial pulses are 1+ on the right side, and 1+ on the left side.  Pulmonary/Chest: Breath sounds normal. No respiratory distress. He has no wheezes.  Abdominal: Soft. Bowel sounds are normal. He exhibits no distension. There is no tenderness. There is no guarding and no CVA tenderness.  Musculoskeletal: He exhibits no edema.       Right shoulder: He exhibits no swelling and no deformity.  Neurological: He is alert and oriented to person, place, and time. He has normal strength and normal reflexes. No cranial nerve deficit or sensory deficit. Gait normal.  Skin: Skin is warm and dry. No rash noted. No cyanosis. Nails show no clubbing.  Psychiatric: He has a normal mood and affect. His speech is normal and behavior is normal. Judgment and thought content normal. Cognition and memory are normal.    Results for orders placed or performed during the hospital encounter of 05/27/14  Glucose, capillary  Result Value Ref Range   Glucose-Capillary 119 (H) 70 - 99 mg/dL  Glucose, capillary  Result Value Ref Range   Glucose-Capillary 153 (H) 70 - 99 mg/dL  Hemoglobin-hemacue, POC  Result Value Ref Range   Hemoglobin 15.7 13.0 - 17.0 g/dL   Complete Blood Count (Most recent): Lab Results  Component Value Date   HGB 15.7 05/27/2014   Chemistry (most  recent): Lab Results  Component Value Date   NA 138 05/26/2014   K 3.7 05/26/2014   CL 103 05/26/2014   CO2 27 05/26/2014   BUN 10 05/26/2014   CREATININE 0.72 05/26/2014   On 01/14/2015 his A1c was 7.3%.    Assessment & Plan:   1. Uncontrolled type 2 diabetes mellitus without complication, with long-term current use of insulin (HCC)  Patient came with improved glucose profile, and  recent A1c of 7.3 % improved from 12.5%.  Glucose logs and insulin administration records pertaining to this visit,  to be scanned into patient's records.  Recent labs reviewed. - Patient remains at a high risk for more acute and chronic complications of diabetes which include CAD, CVA, CKD, retinopathy, and neuropathy. These are all discussed in detail with the patient.  - I have re-counseled the patient on diet management and by adopting a carbohydrate restricted / protein rich  Diet. - Patient is advised  to stick to a routine mealtimes to eat 3 meals  a day and avoid unnecessary snacks ( to snack only to correct hypoglycemia).  - Suggestion is made for patient to avoid simple carbohydrates   from their diet including Cakes , Desserts, Ice Cream,  Soda (  diet and regular) , Sweet Tea , Candies,  Chips, Cookies, Artificial Sweeteners,   and "Sugar-free" Products .  This will help patient to have stable blood glucose profile and potentially avoid unintended  Weight gain.  - The patient  has been  scheduled with Norm Salt, RDN, CDE for individualized DM education. - I have approached patient with the following individualized plan to manage diabetes and patient agrees.  - I will proceed with basal insulinToujeo  20 units QHS, continue  prandial insulin for now associated with strict monitoring of glucose  Q a.m. -Patient is encouraged to call clinic for blood glucose levels less than 70 or above 300 mg /dl. - I will continue Janumet 50/1000 mg extended release by mouth daily, therapeutically suitable  for patient..  - Patient will be considered for incretin therapy as appropriate next visit. - Patient specific target  for A1c; LDL, HDL, Triglycerides, and  Waist Circumference were discussed in detail.  2) BP/HTN: Controlled. Continue current medications.  3) Lipids/HPL: Recent LDL at above target levels. simvastatin 20 mg by mouth daily at bedtime, continue  4)  Weight/Diet: CDE consult in progress, exercise, and carbohydrates information provided.  5) Chronic Care/Health Maintenance:  -Patient is on  Statin medications and encouraged to continue to follow up with Ophthalmology, Podiatrist at least yearly or according to recommendations, and advised to quit stay away from smoking. I have recommended yearly flu vaccine and pneumonia vaccination at least every 5 years; moderate intensity exercise for up to 150 minutes weekly; and  sleep for at least 7 hours a day.  I advised patient to maintain close follow up with their PCP for primary care needs.  Patient is asked to bring meter and  blood glucose logs during their next visit.   Follow up plan: Return in about 3 months (around 04/22/2015) for diabetes, high cholesterol, follow up with pre-visit labs, meter, and logs.  Marquis Lunch, MD Phone: [phone]  Fax: [phone]   01/20/2015, 4:12 PM

## 2015-01-20 NOTE — Patient Instructions (Signed)
Advice for weight management -For most of [place] the best way to control weight is by diet management. Generally speaking, diet management means restricting carbohydrate consumption to minimum possible (and to unprocessed or minimally processed complex starch) and increasing protein intake (animal or plant source), fruits, and vegetables.  -Sticking to a routine mealtime to eat 3 meals a day and avoiding unnecessary snacks is shown to have a big role in weight control.  -It is better to avoid simple carbohydrates including: Cakes, Desserts, Ice Cream, Soda (diet and regular), Sweet Tea, Candies, Chips, Cookies, Artificial Sweeteners, and "Sugar-free" Products.   -Exercise: 30 minutes a day 3-4 days a week, or 150 minutes a week. Combine stretch, strength, and aerobic activities. You may seek evaluation by your heart doctor prior to initiating exercise if you have high risk for heart disease.

## 2015-03-03 ENCOUNTER — Other Ambulatory Visit: Payer: Self-pay

## 2015-03-03 MED ORDER — INSULIN GLARGINE 300 UNIT/ML ~~LOC~~ SOPN: 30.0000 [IU] | PEN_INJECTOR | Freq: Every day | SUBCUTANEOUS | Status: DC

## 2015-03-29 ENCOUNTER — Encounter (INDEPENDENT_AMBULATORY_CARE_PROVIDER_SITE_OTHER): Payer: Self-pay | Admitting: *Deleted

## 2015-04-19 ENCOUNTER — Other Ambulatory Visit: Payer: Self-pay | Admitting: "Endocrinology

## 2015-04-19 LAB — BASIC METABOLIC PANEL
BUN: 14 mg/dL (ref 7–25)
CALCIUM: 9.4 mg/dL (ref 8.6–10.3)
CO2: 26 mmol/L (ref 20–31)
CREATININE: 0.69 mg/dL — AB (ref 0.70–1.33)
Chloride: 103 mmol/L (ref 98–110)
Glucose, Bld: 117 mg/dL — ABNORMAL HIGH (ref 65–99)
Potassium: 4.3 mmol/L (ref 3.5–5.3)
SODIUM: 139 mmol/L (ref 135–146)

## 2015-04-20 LAB — HEMOGLOBIN A1C
HEMOGLOBIN A1C: 7.2 % — AB (ref <5.7)
MEAN PLASMA GLUCOSE: 160 mg/dL — AB (ref <117)

## 2015-04-28 ENCOUNTER — Encounter: Payer: Self-pay | Admitting: "Endocrinology

## 2015-04-28 ENCOUNTER — Ambulatory Visit (INDEPENDENT_AMBULATORY_CARE_PROVIDER_SITE_OTHER): Payer: BLUE CROSS/BLUE SHIELD | Admitting: "Endocrinology

## 2015-04-28 VITALS — BP 134/80 | HR 87 | Ht 69.0 in | Wt 176.0 lb

## 2015-04-28 DIAGNOSIS — Z794 Long term (current) use of insulin: Secondary | ICD-10-CM | POA: Diagnosis not present

## 2015-04-28 DIAGNOSIS — E785 Hyperlipidemia, unspecified: Secondary | ICD-10-CM | POA: Diagnosis not present

## 2015-04-28 DIAGNOSIS — E1165 Type 2 diabetes mellitus with hyperglycemia: Secondary | ICD-10-CM | POA: Diagnosis not present

## 2015-04-28 DIAGNOSIS — IMO0001 Reserved for inherently not codable concepts without codable children: Secondary | ICD-10-CM

## 2015-04-28 NOTE — Progress Notes (Signed)
Subjective:    Patient ID: Peter Simmons, male    DOB: Sep 29, 1960,    Past Medical History  Diagnosis Date  . Diabetes mellitus without complication (HCC)   . Hyperlipemia   . Wears glasses   . Impingement syndrome of right shoulder 05/27/2014  . 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 Knee arthroscopy      rightx2  . Knee arthroscopy      left x1  . Cholecystectomy  2007    hernia fixed also  . Hernia repair  2007    done with lap choli  . Shoulder arthroscopy with subacromial decompression Right 05/27/2014    Procedure: RIGHT SHOULDER ARTHROSCOPY WITH DEBRIDEMENT, ACROMIOPLASTY ;  Surgeon: Eulas Post, MD;  Location: [facility];  Service: Orthopedics;  Laterality: Right;   Social History   Social History  . Marital Status: Married    Spouse Name: N/A  . Number of Children: N/A  . Years of Education: N/A   Social History Main Topics  . Smoking status: Current Every Day Smoker -- 0.50 packs/day  . Smokeless tobacco: None  . Alcohol Use: No  . Drug Use: No  . Sexual Activity: Not Asked   Other Topics Concern  . None   Social History Narrative   Outpatient Encounter Prescriptions as of 04/28/2015  Medication Sig  . Insulin Glargine (TOUJEO SOLOSTAR) 300 UNIT/ML SOPN Inject 30 Units into the skin at bedtime. (Patient taking differently: Inject 20 Units into the skin at bedtime. )  . SitaGLIPtin-MetFORMIN HCl 50-1000 MG TB24 Take by mouth.  . baclofen (LIORESAL) 10 MG tablet Take 1 tablet (10 mg total) by mouth 3 (three) times daily. As needed for muscle spasm  . ondansetron (ZOFRAN) 4 MG tablet Take 1 tablet (4 mg total) by mouth every 8 (eight) hours as needed for nausea or vomiting.  Marland Kitchen oxyCODONE-acetaminophen (PERCOCET) 10-325 MG per tablet Take 1-2 tablets by mouth every 6 (six) hours as needed for pain. MAXIMUM TOTAL ACETAMINOPHEN DOSE IS 4000 MG PER DAY  . sennosides-docusate  sodium (SENOKOT-S) 8.6-50 MG tablet Take 2 tablets by mouth daily.  . simvastatin (ZOCOR) 20 MG tablet Take 20 mg by mouth daily.   No facility-administered encounter medications on file as of 04/28/2015.   ALLERGIES: No Known Allergies VACCINATION STATUS:  There is no immunization history on file for this patient.  HPI  55- yr- old male patient with medical problems as follows. Patient is here to f/u for uncontrolled type 2 DM. Pt is on basal insulin and Janumet 50/1000mg  ER once a day. He came with improved A1c of 7.2% from  12.5% . He feels stronger, he documented near target fasting glucose profile.  He denies polyuria, polydipsia.  Patient was diagnosed with type 2 DM approx. 10 yrs ago, likely due to ETOH abuse. Has been overweight several years ago, and has abused ETOH. Patient denies history of CAD, CVA, CKD, Neuropathy, and Retinopathy. Patient is an active smoker, does not participate in a regular exercise program. Patient is willing to remain committed for change in life style.  Review of Systems  Constitutional: Negative for fatigue and unexpected weight change.  HENT: Negative for dental problem, mouth sores and trouble swallowing.   Eyes: Negative for visual disturbance.  Respiratory: Negative for cough, choking, chest tightness, shortness of breath and wheezing.   Cardiovascular: Negative for chest pain, palpitations and leg swelling.  Gastrointestinal: Negative  for nausea, vomiting, abdominal pain, diarrhea, constipation and abdominal distention.  Endocrine: Negative for polydipsia, polyphagia and polyuria.  Genitourinary: Negative for dysuria, urgency, hematuria and flank pain.  Musculoskeletal: Negative for myalgias, back pain, gait problem and neck pain.  Skin: Negative for pallor, rash and wound.  Neurological: Negative for seizures, syncope, weakness, numbness and headaches.  Psychiatric/Behavioral: Negative.  Negative for confusion and dysphoric mood.     Objective:    BP 134/80 mmHg  Pulse 87  Ht 5\' 9"  (1.753 m)  Wt 176 lb (79.833 kg)  BMI 25.98 kg/m2  SpO2 99%  Wt Readings from Last 3 Encounters:  04/28/15 176 lb (79.833 kg)  01/20/15 170 lb (77.111 kg)  05/27/14 164 lb (74.39 kg)    Physical Exam  Constitutional: He is oriented to person, place, and time. He appears well-developed and well-nourished. He is cooperative. No distress.  HENT:  Head: Normocephalic and atraumatic.  Eyes: EOM are normal.  Neck: Normal range of motion. Neck supple. No tracheal deviation present. No thyromegaly present.  Cardiovascular: Normal rate, S1 normal, S2 normal and normal heart sounds.  Exam reveals no gallop.   No murmur heard. Pulses:      Dorsalis pedis pulses are 1+ on the right side, and 1+ on the left side.       Posterior tibial pulses are 1+ on the right side, and 1+ on the left side.  Pulmonary/Chest: Breath sounds normal. No respiratory distress. He has no wheezes.  Abdominal: Soft. Bowel sounds are normal. He exhibits no distension. There is no tenderness. There is no guarding and no CVA tenderness.  Musculoskeletal: He exhibits no edema.       Right shoulder: He exhibits no swelling and no deformity.  Neurological: He is alert and oriented to person, place, and time. He has normal strength and normal reflexes. No cranial nerve deficit or sensory deficit. Gait normal.  Skin: Skin is warm and dry. No rash noted. No cyanosis. Nails show no clubbing.  Psychiatric: He has a normal mood and affect. His speech is normal and behavior is normal. Judgment and thought content normal. Cognition and memory are normal.    Results for orders placed or performed in visit on 04/19/15  Basic metabolic panel  Result Value Ref Range   Sodium 139 135 - 146 mmol/L   Potassium 4.3 3.5 - 5.3 mmol/L   Chloride 103 98 - 110 mmol/L   CO2 26 20 - 31 mmol/L   Glucose, Bld 117 (H) 65 - 99 mg/dL   BUN 14 7 - 25 mg/dL   Creat 1.910.69 (L) 4.780.70 - 1.33 mg/dL    Calcium 9.4 8.6 - 29.510.3 mg/dL  Hemoglobin A2ZA1c  Result Value Ref Range   Hgb A1c MFr Bld 7.2 (H) <5.7 %   Mean Plasma Glucose 160 (H) <117 mg/dL   Complete Blood Count (Most recent): Lab Results  Component Value Date   HGB 15.7 05/27/2014   Chemistry (most recent): Lab Results  Component Value Date   NA 139 04/19/2015   K 4.3 04/19/2015   CL 103 04/19/2015   CO2 26 04/19/2015   BUN 14 04/19/2015   CREATININE 0.69* 04/19/2015     Assessment & Plan:   1. Uncontrolled type 2 diabetes mellitus without complication, with long-term current use of insulin (HCC)  Patient came with improved glucose profile, and  recent A1c of 7.2 % improved from 12.5%.  Glucose logs and insulin administration records pertaining to this visit,  to be  scanned into patient's records.  Recent labs reviewed. - Patient remains at a high risk for more acute and chronic complications of diabetes which include CAD, CVA, CKD, retinopathy, and neuropathy. These are all discussed in detail with the patient.  - I have re-counseled the patient on diet management and by adopting a carbohydrate restricted / protein rich  Diet. - Patient is advised to stick to a routine mealtimes to eat 3 meals  a day and avoid unnecessary snacks ( to snack only to correct hypoglycemia).  - Suggestion is made for patient to avoid simple carbohydrates   from their diet including Cakes , Desserts, Ice Cream,  Soda (  diet and regular) , Sweet Tea , Candies,  Chips, Cookies, Artificial Sweeteners,   and "Sugar-free" Products .  This will help patient to have stable blood glucose profile and potentially avoid unintended  Weight gain.  - The patient  has been  scheduled with Norm Salt, RDN, CDE for individualized DM education. - I have approached patient with the following individualized plan to manage diabetes and patient agrees.  - I will proceed with basal insulinToujeo  20 units QHS, associated with strict monitoring of glucose  Q  a.m. -Patient is encouraged to call clinic for blood glucose levels less than 70 or above 300 mg /dl. - I will continue Janumet 50/1000 mg extended release by mouth daily, therapeutically suitable for patient.  - Patient is not a candidate for incretin therapy due to his history of alcohol abuse putting him at risk for pancreatitis. - Patient specific target  for A1c; LDL, HDL, Triglycerides, and  Waist Circumference were discussed in detail.  2) BP/HTN: Controlled. Continue current medications.  3) Lipids/HPL: Recent LDL at above target levels. simvastatin 20 mg by mouth daily at bedtime, continue  4)  Weight/Diet: CDE consult in progress, exercise, and carbohydrates information provided.  5) Chronic Care/Health Maintenance:  -Patient is on  Statin medications and encouraged to continue to follow up with Ophthalmology, Podiatrist at least yearly or according to recommendations, and advised to quit stay away from smoking. I have recommended yearly flu vaccine and pneumonia vaccination at least every 5 years; moderate intensity exercise for up to 150 minutes weekly; and  sleep for at least 7 hours a day.  I advised patient to maintain close follow up with his PCP for primary care needs.  Patient is asked to bring meter and  blood glucose logs during their next visit.   Follow up plan: Return in about 3 months (around 07/27/2015) for diabetes, high blood pressure, follow up with pre-visit labs, meter, and logs.  Marquis Lunch, MD Phone: [phone]  Fax: [phone]   04/28/2015, 3:48 PM

## 2015-05-07 ENCOUNTER — Other Ambulatory Visit: Payer: Self-pay | Admitting: "Endocrinology

## 2015-06-24 ENCOUNTER — Other Ambulatory Visit: Payer: Self-pay | Admitting: "Endocrinology

## 2015-07-09 ENCOUNTER — Other Ambulatory Visit: Payer: Self-pay | Admitting: "Endocrinology

## 2015-07-10 ENCOUNTER — Other Ambulatory Visit: Payer: Self-pay

## 2015-07-10 MED ORDER — INSULIN DEGLUDEC 100 UNIT/ML ~~LOC~~ SOPN
30.0000 [IU] | PEN_INJECTOR | Freq: Every day | SUBCUTANEOUS | Status: DC
Start: 2015-07-10 — End: 2015-12-15

## 2015-07-10 MED ORDER — INSULIN DEGLUDEC 100 UNIT/ML ~~LOC~~ SOPN: 30.0000 [IU] | PEN_INJECTOR | Freq: Every day | SUBCUTANEOUS | Status: DC

## 2015-07-17 ENCOUNTER — Other Ambulatory Visit: Payer: Self-pay | Admitting: "Endocrinology

## 2015-07-17 LAB — BASIC METABOLIC PANEL
BUN: 17 mg/dL (ref 7–25)
CALCIUM: 9.7 mg/dL (ref 8.6–10.3)
CHLORIDE: 102 mmol/L (ref 98–110)
CO2: 25 mmol/L (ref 20–31)
Creat: 0.72 mg/dL (ref 0.70–1.33)
GLUCOSE: 190 mg/dL — AB (ref 65–99)
POTASSIUM: 4.3 mmol/L (ref 3.5–5.3)
SODIUM: 137 mmol/L (ref 135–146)

## 2015-07-17 LAB — HEMOGLOBIN A1C
Hgb A1c MFr Bld: 7.2 % — ABNORMAL HIGH (ref <5.7)
Mean Plasma Glucose: 160 mg/dL

## 2015-07-28 ENCOUNTER — Ambulatory Visit: Payer: BLUE CROSS/BLUE SHIELD | Admitting: "Endocrinology

## 2015-08-11 ENCOUNTER — Encounter: Payer: Self-pay | Admitting: "Endocrinology

## 2015-08-11 ENCOUNTER — Ambulatory Visit (INDEPENDENT_AMBULATORY_CARE_PROVIDER_SITE_OTHER): Payer: BLUE CROSS/BLUE SHIELD | Admitting: "Endocrinology

## 2015-08-11 VITALS — BP 123/77 | HR 78 | Ht 69.0 in | Wt 184.0 lb

## 2015-08-11 DIAGNOSIS — Z794 Long term (current) use of insulin: Secondary | ICD-10-CM

## 2015-08-11 DIAGNOSIS — E785 Hyperlipidemia, unspecified: Secondary | ICD-10-CM | POA: Diagnosis not present

## 2015-08-11 DIAGNOSIS — E1165 Type 2 diabetes mellitus with hyperglycemia: Secondary | ICD-10-CM

## 2015-08-11 DIAGNOSIS — IMO0001 Reserved for inherently not codable concepts without codable children: Secondary | ICD-10-CM

## 2015-08-11 NOTE — Progress Notes (Signed)
Subjective:    Patient ID: Peter Simmons, male    DOB: 1960-08-04,    Past Medical History  Diagnosis Date  . Diabetes mellitus without complication (HCC)   . Hyperlipemia   . Wears glasses   . Impingement syndrome of right shoulder 05/27/2014  . 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 Knee arthroscopy      rightx2  . Knee arthroscopy      left x1  . Cholecystectomy  2007    hernia fixed also  . Hernia repair  2007    done with lap choli  . Shoulder arthroscopy with subacromial decompression Right 05/27/2014    Procedure: RIGHT SHOULDER ARTHROSCOPY WITH DEBRIDEMENT, ACROMIOPLASTY ;  Surgeon: Eulas Post, MD;  Location: [facility];  Service: Orthopedics;  Laterality: Right;   Social History   Social History  . Marital Status: Married    Spouse Name: N/A  . Number of Children: N/A  . Years of Education: N/A   Social History Main Topics  . Smoking status: Current Every Day Smoker -- 0.50 packs/day  . Smokeless tobacco: None  . Alcohol Use: No  . Drug Use: No  . Sexual Activity: Not Asked   Other Topics Concern  . None   Social History Narrative   Outpatient Encounter Prescriptions as of 08/11/2015  Medication Sig  . baclofen (LIORESAL) 10 MG tablet Take 1 tablet (10 mg total) by mouth 3 (three) times daily. As needed for muscle spasm  . Insulin Degludec (TRESIBA FLEXTOUCH) 100 UNIT/ML SOPN Inject 30 Units into the skin at bedtime.  . ondansetron (ZOFRAN) 4 MG tablet Take 1 tablet (4 mg total) by mouth every 8 (eight) hours as needed for nausea or vomiting.  Marland Kitchen oxyCODONE-acetaminophen (PERCOCET) 10-325 MG per tablet Take 1-2 tablets by mouth every 6 (six) hours as needed for pain. MAXIMUM TOTAL ACETAMINOPHEN DOSE IS 4000 MG PER DAY  . sennosides-docusate sodium (SENOKOT-S) 8.6-50 MG tablet Take 2 tablets by mouth daily.  . simvastatin (ZOCOR) 20 MG tablet Take 20 mg by mouth daily.  .  SitaGLIPtin-MetFORMIN HCl 50-1000 MG TB24 Take by mouth.  . TOUJEO SOLOSTAR 300 UNIT/ML SOPN INJECT 30 UNITS INTO THE SKIN AT BEDTIME.  . [DISCONTINUED] simvastatin (ZOCOR) 40 MG tablet TAKE 1 TABLET AT BEDTIME   No facility-administered encounter medications on file as of 08/11/2015.   ALLERGIES: No Known Allergies VACCINATION STATUS:  There is no immunization history on file for this patient.  HPI  55- yr- old male patient with medical problems as follows. Patient is here to f/u for uncontrolled type 2 DM. Pt is on basal insulin and Janumet 50/1000mg  ER once a day. He came with Stable A1c of 7.2% from  12.5% . He feels stronger, he documented near target fasting glucose profile.  He denies polyuria, polydipsia.  Patient was diagnosed with type 2 DM approx. 10 yrs ago, likely due to ETOH abuse. Has been overweight several years ago, and has abused ETOH. Patient denies history of CAD, CVA, CKD, Neuropathy, and Retinopathy. Patient is an active smoker, does not participate in a regular exercise program. Patient is willing to remain committed for change in life style.  Review of Systems  Constitutional: Negative for fatigue and unexpected weight change.  HENT: Negative for dental problem, mouth sores and trouble swallowing.   Eyes: Negative for visual disturbance.  Respiratory: Negative for cough, choking, chest tightness, shortness of breath  and wheezing.   Cardiovascular: Negative for chest pain, palpitations and leg swelling.  Gastrointestinal: Negative for nausea, vomiting, abdominal pain, diarrhea, constipation and abdominal distention.  Endocrine: Negative for polydipsia, polyphagia and polyuria.  Genitourinary: Negative for dysuria, urgency, hematuria and flank pain.  Musculoskeletal: Negative for myalgias, back pain, gait problem and neck pain.  Skin: Negative for pallor, rash and wound.  Neurological: Negative for seizures, syncope, weakness, numbness and headaches.   Psychiatric/Behavioral: Negative.  Negative for confusion and dysphoric mood.    Objective:    BP 123/77 mmHg  Pulse 78  Ht 5\' 9"  (1.753 m)  Wt 184 lb (83.462 kg)  BMI 27.16 kg/m2  SpO2 97%  Wt Readings from Last 3 Encounters:  08/11/15 184 lb (83.462 kg)  04/28/15 176 lb (79.833 kg)  01/20/15 170 lb (77.111 kg)    Physical Exam  Constitutional: He is oriented to person, place, and time. He appears well-developed and well-nourished. He is cooperative. No distress.  HENT:  Head: Normocephalic and atraumatic.  Eyes: EOM are normal.  Neck: Normal range of motion. Neck supple. No tracheal deviation present. No thyromegaly present.  Cardiovascular: Normal rate, S1 normal, S2 normal and normal heart sounds.  Exam reveals no gallop.   No murmur heard. Pulses:      Dorsalis pedis pulses are 1+ on the right side, and 1+ on the left side.       Posterior tibial pulses are 1+ on the right side, and 1+ on the left side.  Pulmonary/Chest: Breath sounds normal. No respiratory distress. He has no wheezes.  Abdominal: Soft. Bowel sounds are normal. He exhibits no distension. There is no tenderness. There is no guarding and no CVA tenderness.  Musculoskeletal: He exhibits no edema.       Right shoulder: He exhibits no swelling and no deformity.  Neurological: He is alert and oriented to person, place, and time. He has normal strength and normal reflexes. No cranial nerve deficit or sensory deficit. Gait normal.  Skin: Skin is warm and dry. No rash noted. No cyanosis. Nails show no clubbing.  Psychiatric: He has a normal mood and affect. His speech is normal and behavior is normal. Judgment and thought content normal. Cognition and memory are normal.    Results for orders placed or performed in visit on 07/17/15  Basic metabolic panel  Result Value Ref Range   Sodium 137 135 - 146 mmol/L   Potassium 4.3 3.5 - 5.3 mmol/L   Chloride 102 98 - 110 mmol/L   CO2 25 20 - 31 mmol/L   Glucose, Bld  190 (H) 65 - 99 mg/dL   BUN 17 7 - 25 mg/dL   Creat 2.950.72 6.210.70 - 3.081.33 mg/dL   Calcium 9.7 8.6 - 65.710.3 mg/dL  Hemoglobin Q4OA1c  Result Value Ref Range   Hgb A1c MFr Bld 7.2 (H) <5.7 %   Mean Plasma Glucose 160 mg/dL   Complete Blood Count (Most recent): Lab Results  Component Value Date   HGB 15.7 05/27/2014   Chemistry (most recent): Lab Results  Component Value Date   NA 137 07/17/2015   K 4.3 07/17/2015   CL 102 07/17/2015   CO2 25 07/17/2015   BUN 17 07/17/2015   CREATININE 0.72 07/17/2015     Assessment & Plan:   1. Uncontrolled type 2 diabetes mellitus without complication, with long-term current use of insulin (HCC)  Patient came with Controlled glucose profile averaging 135 or fasting blood glucose readings, and  recent A1c  of 7.2 % improved from 12.5%.  Glucose logs and insulin administration records pertaining to this visit,  to be scanned into patient's records.  Recent labs reviewed. - Patient remains at a high risk for more acute and chronic complications of diabetes which include CAD, CVA, CKD, retinopathy, and neuropathy. These are all discussed in detail with the patient.  - I have re-counseled the patient on diet management and by adopting a carbohydrate restricted / protein rich  Diet. - Patient is advised to stick to a routine mealtimes to eat 3 meals  a day and avoid unnecessary snacks ( to snack only to correct hypoglycemia).  - Suggestion is made for patient to avoid simple carbohydrates   from their diet including Cakes , Desserts, Ice Cream,  Soda (  diet and regular) , Sweet Tea , Candies,  Chips, Cookies, Artificial Sweeteners,   and "Sugar-free" Products .  This will help patient to have stable blood glucose profile and potentially avoid unintended  Weight gain.  - The patient  has been  scheduled with Norm Salt, RDN, CDE for individualized DM education. - I have approached patient with the following individualized plan to manage diabetes and patient  agrees.  - I will proceed with basal insulinToujeo  20 units QHS, associated with strict monitoring of glucose  Q a.m. -Patient is encouraged to call clinic for blood glucose levels less than 70 or above 300 mg /dl. - I will continue Janumet 50/1000 mg extended release by mouth daily, therapeutically suitable for patient.  - Patient is not a candidate for incretin therapy due to his history of alcohol abuse putting him at risk for pancreatitis. - Patient specific target  for A1c; LDL, HDL, Triglycerides, and  Waist Circumference were discussed in detail.  2) BP/HTN: Controlled. Continue current medications.  3) Lipids/HPL: Recent LDL at above target levels. simvastatin 20 mg by mouth daily at bedtime, continue  4)  Weight/Diet: CDE consult in progress, exercise, and carbohydrates information provided.  5) Chronic Care/Health Maintenance:  -Patient is on  Statin medications and encouraged to continue to follow up with Ophthalmology, Podiatrist at least yearly or according to recommendations, and advised to quit stay away from smoking. I have recommended yearly flu vaccine and pneumonia vaccination at least every 5 years; moderate intensity exercise for up to 150 minutes weekly; and  sleep for at least 7 hours a day.  I advised patient to maintain close follow up with his PCP for primary care needs.  Patient is asked to bring meter and  blood glucose logs during their next visit.   Follow up plan: Return in about 4 months (around 12/11/2015) for diabetes, high cholesterol, follow up with pre-visit labs, meter, and logs.  Marquis Lunch, MD Phone: [phone]  Fax: [phone]   08/11/2015, 3:39 PM

## 2015-11-01 ENCOUNTER — Other Ambulatory Visit: Payer: Self-pay | Admitting: "Endocrinology

## 2015-12-02 ENCOUNTER — Other Ambulatory Visit: Payer: Self-pay | Admitting: "Endocrinology

## 2015-12-02 LAB — COMPLETE METABOLIC PANEL WITH GFR
ALT: 13 U/L (ref 9–46)
AST: 14 U/L (ref 10–35)
Albumin: 4.1 g/dL (ref 3.6–5.1)
Alkaline Phosphatase: 55 U/L (ref 40–115)
BILIRUBIN TOTAL: 0.5 mg/dL (ref 0.2–1.2)
BUN: 16 mg/dL (ref 7–25)
CO2: 25 mmol/L (ref 20–31)
CREATININE: 0.78 mg/dL (ref 0.70–1.33)
Calcium: 8.6 mg/dL (ref 8.6–10.3)
Chloride: 104 mmol/L (ref 98–110)
GFR, Est Non African American: >89 mL/min (ref >=60)
GLUCOSE: 188 mg/dL — AB (ref 65–99)
Potassium: 4.1 mmol/L (ref 3.5–5.3)
SODIUM: 137 mmol/L (ref 135–146)
TOTAL PROTEIN: 6.1 g/dL (ref 6.1–8.1)

## 2015-12-04 LAB — HEMOGLOBIN A1C
Hgb A1c MFr Bld: 6.8 % — ABNORMAL HIGH (ref <5.7)
Mean Plasma Glucose: 148 mg/dL

## 2015-12-15 ENCOUNTER — Encounter: Payer: Self-pay | Admitting: "Endocrinology

## 2015-12-15 ENCOUNTER — Ambulatory Visit (INDEPENDENT_AMBULATORY_CARE_PROVIDER_SITE_OTHER): Payer: BLUE CROSS/BLUE SHIELD | Admitting: "Endocrinology

## 2015-12-15 ENCOUNTER — Ambulatory Visit: Payer: BLUE CROSS/BLUE SHIELD | Admitting: "Endocrinology

## 2015-12-15 VITALS — BP 118/72 | HR 70 | Ht 69.0 in | Wt 187.0 lb

## 2015-12-15 DIAGNOSIS — E1165 Type 2 diabetes mellitus with hyperglycemia: Secondary | ICD-10-CM

## 2015-12-15 DIAGNOSIS — IMO0001 Reserved for inherently not codable concepts without codable children: Secondary | ICD-10-CM

## 2015-12-15 DIAGNOSIS — E785 Hyperlipidemia, unspecified: Secondary | ICD-10-CM

## 2015-12-15 DIAGNOSIS — Z794 Long term (current) use of insulin: Secondary | ICD-10-CM

## 2015-12-15 NOTE — Progress Notes (Signed)
Subjective:    Patient ID: Peter Simmons, male    DOB: [birth year],    Past Medical History:  Diagnosis Date  . Adhesive capsulitis of right shoulder 05/27/2014  . Diabetes mellitus without complication (HCC)   . Hyperlipemia   . Impingement syndrome of right shoulder 05/27/2014  . Wears glasses    Past Surgical History:  Procedure Laterality Date  . CHOLECYSTECTOMY  2007   hernia fixed also  . HERNIA REPAIR  2007   done with lap choli  . KNEE ARTHROSCOPY     rightx2  . KNEE ARTHROSCOPY     left x1  . SHOULDER ARTHROSCOPY WITH SUBACROMIAL DECOMPRESSION Right 05/27/2014   Procedure: RIGHT SHOULDER ARTHROSCOPY WITH DEBRIDEMENT, ACROMIOPLASTY ;  Surgeon: Eulas Post, MD;  Location: [facility];  Service: Orthopedics;  Laterality: Right;   Social History   Social History  . Marital status: Married    Spouse name: N/A  . Number of children: N/A  . Years of education: N/A   Social History Main Topics  . Smoking status: Current Every Day Smoker    Packs/day: 0.50  . Smokeless tobacco: Never Used  . Alcohol use No  . Drug use: No  . Sexual activity: Not Asked   Other Topics Concern  . None   Social History Narrative  . None   Outpatient Encounter Prescriptions as of 12/15/2015  Medication Sig  . Omega-3 Fatty Acids (FISH OIL) 1200 MG CAPS Take by mouth.  . simvastatin (ZOCOR) 20 MG tablet Take 20 mg by mouth at bedtime.  . SitaGLIPtin-MetFORMIN HCl 50-1000 MG TB24 Take by mouth.  [name] SOLOSTAR 300 UNIT/ML SOPN INJECT 30 UNITS INTO THE SKIN AT BEDTIME. (Patient taking differently: INJECT 20 UNITS INTO THE SKIN AT BEDTIME.)  . [DISCONTINUED] baclofen (LIORESAL) 10 MG tablet Take 1 tablet (10 mg total) by mouth 3 (three) times daily. As needed for muscle spasm  . [DISCONTINUED] Insulin Degludec (TRESIBA FLEXTOUCH) 100 UNIT/ML SOPN Inject 30 Units into the skin at bedtime.  . [DISCONTINUED] ondansetron (ZOFRAN) 4 MG tablet Take 1 tablet (4 mg total) by  mouth every 8 (eight) hours as needed for nausea or vomiting.  . [DISCONTINUED] oxyCODONE-acetaminophen (PERCOCET) 10-325 MG per tablet Take 1-2 tablets by mouth every 6 (six) hours as needed for pain. MAXIMUM TOTAL ACETAMINOPHEN DOSE IS 4000 MG PER DAY  . [DISCONTINUED] sennosides-docusate sodium (SENOKOT-S) 8.6-50 MG tablet Take 2 tablets by mouth daily.  . [DISCONTINUED] simvastatin (ZOCOR) 20 MG tablet Take 20 mg by mouth daily.  . [DISCONTINUED] simvastatin (ZOCOR) 40 MG tablet TAKE 1 TABLET AT BEDTIME   No facility-administered encounter medications on file as of 12/15/2015.    ALLERGIES: No Known Allergies VACCINATION STATUS:  There is no immunization history on file for this patient.  HPI  55- yr- old male patient with medical problems as follows. Patient is here to f/u for uncontrolled type 2 DM. Pt is on basal insulin and Janumet 50/1000mg  ER once a day. He came with Stable A1c of 6.8 %, generally improving from  12.5% . He feels stronger, he documented near target fasting glucose profile.  He denies polyuria, polydipsia.  Patient was diagnosed with type 2 DM approx. 10 yrs ago, likely due to ETOH abuse. Has been overweight several years ago, and has abused ETOH. Patient denies history of CAD, CVA, CKD, Neuropathy, and Retinopathy. Patient is an active smoker, does not participate in a regular exercise program. Patient is willing to  remain committed for change in life style.  Review of Systems  Constitutional: Negative for fatigue and unexpected weight change.  HENT: Negative for dental problem, mouth sores and trouble swallowing.   Eyes: Negative for visual disturbance.  Respiratory: Negative for cough, choking, chest tightness, shortness of breath and wheezing.   Cardiovascular: Negative for chest pain, palpitations and leg swelling.  Gastrointestinal: Negative for abdominal distention, abdominal pain, constipation, diarrhea, nausea and vomiting.  Endocrine: Negative for  polydipsia, polyphagia and polyuria.  Genitourinary: Negative for dysuria, flank pain, hematuria and urgency.  Musculoskeletal: Negative for back pain, gait problem, myalgias and neck pain.  Skin: Negative for pallor, rash and wound.  Neurological: Negative for seizures, syncope, weakness, numbness and headaches.  Psychiatric/Behavioral: Negative.  Negative for confusion and dysphoric mood.    Objective:    BP 118/72   Pulse 70   Ht 5\' 9"  (1.753 m)   Wt 187 lb (84.8 kg)   BMI 27.62 kg/m   Wt Readings from Last 3 Encounters:  12/15/15 187 lb (84.8 kg)  08/11/15 184 lb (83.5 kg)  04/28/15 176 lb (79.8 kg)    Physical Exam  Constitutional: He is oriented to person, place, and time. He appears well-developed and well-nourished. He is cooperative. No distress.  HENT:  Head: Normocephalic and atraumatic.  Eyes: EOM are normal.  Neck: Normal range of motion. Neck supple. No tracheal deviation present. No thyromegaly present.  Cardiovascular: Normal rate, S1 normal, S2 normal and normal heart sounds.  Exam reveals no gallop.   No murmur heard. Pulses:      Dorsalis pedis pulses are 1+ on the right side, and 1+ on the left side.       Posterior tibial pulses are 1+ on the right side, and 1+ on the left side.  Pulmonary/Chest: Breath sounds normal. No respiratory distress. He has no wheezes.  Abdominal: Soft. Bowel sounds are normal. He exhibits no distension. There is no tenderness. There is no guarding and no CVA tenderness.  Musculoskeletal: He exhibits no edema.       Right shoulder: He exhibits no swelling and no deformity.  Neurological: He is alert and oriented to person, place, and time. He has normal strength and normal reflexes. No cranial nerve deficit or sensory deficit. Gait normal.  Skin: Skin is warm and dry. No rash noted. No cyanosis. Nails show no clubbing.  Psychiatric: He has a normal mood and affect. His speech is normal and behavior is normal. Judgment and thought  content normal. Cognition and memory are normal.    Results for orders placed or performed in visit on 12/02/15  COMPLETE METABOLIC PANEL WITH GFR  Result Value Ref Range   Sodium 137 135 - 146 mmol/L   Potassium 4.1 3.5 - 5.3 mmol/L   Chloride 104 98 - 110 mmol/L   CO2 25 20 - 31 mmol/L   Glucose, Bld 188 (H) 65 - 99 mg/dL   BUN 16 7 - 25 mg/dL   Creat 7.820.78 9.560.70 - 2.131.33 mg/dL   Total Bilirubin 0.5 0.2 - 1.2 mg/dL   Alkaline Phosphatase 55 40 - 115 U/L   AST 14 10 - 35 U/L   ALT 13 9 - 46 U/L   Total Protein 6.1 6.1 - 8.1 g/dL   Albumin 4.1 3.6 - 5.1 g/dL   Calcium 8.6 8.6 - 08.610.3 mg/dL   GFR, Est African American >89 >=60 mL/min   GFR, Est Non African American >89 >=60 mL/min  Hemoglobin A1c  Result Value Ref Range   Hgb A1c MFr Bld 6.8 (H) <5.7 %   Mean Plasma Glucose 148 mg/dL   Complete Blood Count (Most recent): Lab Results  Component Value Date   HGB 15.7 05/27/2014   Chemistry (most recent): Lab Results  Component Value Date   NA 137 12/02/2015   K 4.1 12/02/2015   CL 104 12/02/2015   CO2 25 12/02/2015   BUN 16 12/02/2015   CREATININE 0.78 12/02/2015     Assessment & Plan:   1. Uncontrolled type 2 diabetes mellitus without complication, with long-term current use of insulin (HCC)  Patient came with Controlled glucose profile averaging 135 or fasting blood glucose readings, and  recent A1c of 6.8 % , generally improved from 12.5%.  Glucose logs and insulin administration records pertaining to this visit,  to be scanned into patient's records.  Recent labs reviewed. - Patient remains at a high risk for more acute and chronic complications of diabetes which include CAD, CVA, CKD, retinopathy, and neuropathy. These are all discussed in detail with the patient.  - I have re-counseled the patient on diet management and by adopting a carbohydrate restricted / protein rich  Diet. - Patient is advised to stick to a routine mealtimes to eat 3 meals  a day and avoid  unnecessary snacks ( to snack only to correct hypoglycemia).  - Suggestion is made for patient to avoid simple carbohydrates   from their diet including Cakes , Desserts, Ice Cream,  Soda (  diet and regular) , Sweet Tea , Candies,  Chips, Cookies, Artificial Sweeteners,   and "Sugar-free" Products .  This will help patient to have stable blood glucose profile and potentially avoid unintended  Weight gain.  - The patient  has been  scheduled with Norm Salt, RDN, CDE for individualized DM education. - I have approached patient with the following individualized plan to manage diabetes and patient agrees.  - I will proceed with basal insulinToujeo  20 units QHS, associated with strict monitoring of glucose  Q a.m. -Patient is encouraged to call clinic for blood glucose levels less than 70 or above 300 mg /dl. - I will continue Janumet 50/1000 mg extended release by mouth daily, therapeutically suitable for patient.  - Patient is not a candidate for incretin therapy due to his history of alcohol abuse putting him at risk for pancreatitis. - Patient specific target  for A1c; LDL, HDL, Triglycerides, and  Waist Circumference were discussed in detail.  2) BP/HTN: Controlled. Continue current medications.  3) Lipids/HPL: Recent LDL at above target levels. simvastatin 20 mg by mouth daily at bedtime, continue  4)  Weight/Diet: CDE consult in progress, exercise, and carbohydrates information provided.  5) Chronic Care/Health Maintenance:  -Patient is on  Statin medications and encouraged to continue to follow up with Ophthalmology, Podiatrist at least yearly or according to recommendations, and advised to quit smoking. I have recommended yearly flu vaccine and pneumonia vaccination at least every 5 years; moderate intensity exercise for up to 150 minutes weekly; and  sleep for at least 7 hours a day.  I advised patient to maintain close follow up with his PCP for primary care needs.  Patient is  asked to bring meter and  blood glucose logs during their next visit.   Follow up plan: Return in about 3 months (around 03/15/2016) for follow up with pre-visit labs, meter, and logs.  Marquis Lunch, MD Phone: [phone]  Fax: [phone]   12/15/2015, 1:31 PM

## 2015-12-16 ENCOUNTER — Other Ambulatory Visit: Payer: Self-pay | Admitting: "Endocrinology

## 2016-01-12 ENCOUNTER — Other Ambulatory Visit: Payer: Self-pay | Admitting: "Endocrinology

## 2016-01-17 ENCOUNTER — Other Ambulatory Visit: Payer: Self-pay

## 2016-01-17 MED ORDER — PEN NEEDLES 32G X 6 MM MISC: 1.0000 | Freq: Four times a day (QID) | 3 refills | Status: DC

## 2016-01-18 ENCOUNTER — Other Ambulatory Visit: Payer: Self-pay

## 2016-01-18 ENCOUNTER — Telehealth: Payer: Self-pay | Admitting: "Endocrinology

## 2016-01-18 MED ORDER — PEN NEEDLES 32G X 6 MM MISC: 1.0000 | Freq: Four times a day (QID) | 3 refills | Status: DC

## 2016-01-18 NOTE — Telephone Encounter (Signed)
Pen needled need to be sent to pharmacy; apparently the pharmacy faxed a request Monday and they are waiting for us... Thank you!

## 2016-01-18 NOTE — Telephone Encounter (Signed)
Notified pt that he can come get pen needle samples and we have also sent in a prescription for different pen needles since the BD brand is on back order. We cannot justify to insurance that the BD is the only pen needle that the pt can use.

## 2016-02-22 ENCOUNTER — Other Ambulatory Visit: Payer: Self-pay | Admitting: "Endocrinology

## 2016-03-02 ENCOUNTER — Other Ambulatory Visit: Payer: Self-pay | Admitting: "Endocrinology

## 2016-03-02 LAB — COMPLETE METABOLIC PANEL WITH GFR
ALT: 11 U/L (ref 9–46)
AST: 13 U/L (ref 10–35)
Albumin: 4.2 g/dL (ref 3.6–5.1)
Alkaline Phosphatase: 60 U/L (ref 40–115)
BILIRUBIN TOTAL: 0.5 mg/dL (ref 0.2–1.2)
BUN: 15 mg/dL (ref 7–25)
CHLORIDE: 105 mmol/L (ref 98–110)
CO2: 24 mmol/L (ref 20–31)
Calcium: 9.3 mg/dL (ref 8.6–10.3)
Creat: 0.83 mg/dL (ref 0.70–1.33)
Glucose, Bld: 132 mg/dL — ABNORMAL HIGH (ref 65–99)
Potassium: 4.4 mmol/L (ref 3.5–5.3)
Sodium: 137 mmol/L (ref 135–146)
Total Protein: 6.7 g/dL (ref 6.1–8.1)

## 2016-03-02 LAB — MICROALBUMIN / CREATININE URINE RATIO
Creatinine, Urine: 153 mg/dL (ref 20–370)
MICROALB UR: 0.2 mg/dL
MICROALB/CREAT RATIO: 1 ug/mg{creat} (ref <30)

## 2016-03-03 LAB — HEMOGLOBIN A1C
Hgb A1c MFr Bld: 6.7 % — ABNORMAL HIGH (ref <5.7)
Mean Plasma Glucose: 146 mg/dL

## 2016-03-22 ENCOUNTER — Ambulatory Visit: Payer: BLUE CROSS/BLUE SHIELD | Admitting: "Endocrinology

## 2016-04-02 ENCOUNTER — Encounter: Payer: Self-pay | Admitting: "Endocrinology

## 2016-04-02 ENCOUNTER — Ambulatory Visit (INDEPENDENT_AMBULATORY_CARE_PROVIDER_SITE_OTHER): Payer: BLUE CROSS/BLUE SHIELD | Admitting: "Endocrinology

## 2016-04-02 VITALS — BP 104/70 | Ht 69.0 in | Wt 180.0 lb

## 2016-04-02 DIAGNOSIS — E782 Mixed hyperlipidemia: Secondary | ICD-10-CM

## 2016-04-02 DIAGNOSIS — Z794 Long term (current) use of insulin: Secondary | ICD-10-CM

## 2016-04-02 DIAGNOSIS — IMO0001 Reserved for inherently not codable concepts without codable children: Secondary | ICD-10-CM

## 2016-04-02 DIAGNOSIS — E1165 Type 2 diabetes mellitus with hyperglycemia: Secondary | ICD-10-CM | POA: Diagnosis not present

## 2016-04-02 NOTE — Patient Instructions (Signed)

## 2016-04-02 NOTE — Progress Notes (Signed)
Subjective:    Patient ID: Peter Simmons, male    DOB: 05-26-1960,    Past Medical History:  Diagnosis Date  . Adhesive capsulitis of right shoulder 05/27/2014  . Diabetes mellitus without complication (HCC)   . Hyperlipemia   . Impingement syndrome of right shoulder 05/27/2014  . Wears glasses    Past Surgical History:  Procedure Laterality Date  . CHOLECYSTECTOMY  2007   hernia fixed also  . HERNIA REPAIR  2007   done with lap choli  . KNEE ARTHROSCOPY     rightx2  . KNEE ARTHROSCOPY     left x1  . SHOULDER ARTHROSCOPY WITH SUBACROMIAL DECOMPRESSION Right 05/27/2014   Procedure: RIGHT SHOULDER ARTHROSCOPY WITH DEBRIDEMENT, ACROMIOPLASTY ;  Surgeon: Eulas PostJoshua P Landau, MD;  [facility]  [facility];  Service: Orthopedics;  Laterality: Right;   Social History   Social History  . Marital status: Married    Spouse name: N/A  . Number of children: N/A  . Years of education: N/A   Social History Main Topics  . Smoking status: Current Every Day Smoker    Packs/day: 0.50  . Smokeless tobacco: Never Used  . Alcohol use No  . Drug use: No  . Sexual activity: Not Asked   Other Topics Concern  . None   Social History Narrative  . None   Outpatient Encounter Prescriptions as of 04/02/2016  Medication Sig  . FREESTYLE LITE test strip USE TO TEST BLOOD SUGAR 4 TIMES A DAY  . Insulin Pen Needle (PEN NEEDLES) 32G X 6 MM MISC 1 each by Does not apply route 4 (four) times daily.  Marland Kitchen. JANUMET XR 50-1000 MG TB24 TAKE 1 TABLET TWICE A DAY  . Omega-3 Fatty Acids (FISH OIL) 1200 MG CAPS Take by mouth.  . simvastatin (ZOCOR) 20 MG tablet Take 20 mg by mouth at bedtime.  [name]. TOUJEO SOLOSTAR 300 UNIT/ML SOPN INJECT 30 UNITS INTO THE SKIN AT BEDTIME. (Patient taking differently: INJECT 20 UNITS INTO THE SKIN AT BEDTIME.)   No facility-administered encounter medications on file as of 04/02/2016.    ALLERGIES: No Known Allergies VACCINATION STATUS:  There is no immunization  history on file for this patient.  HPI  55- yr- old male patient with medical problems as follows. Patient is here to f/u for uncontrolled type 2 DM. Pt is on basal insulin and Janumet 50/1000mg  ER once a day. He came with Stable A1c of 6.7 %, generally improving from  12.5% . He feels stronger, he documented near target fasting glucose profile.  He denies polyuria, polydipsia.  Patient was diagnosed with type 2 DM approx. 12 yrs ago, likely due to ETOH abuse. Has been overweight several years ago, and has abused ETOH. Patient denies history of CAD, CVA, CKD, Neuropathy, and Retinopathy. Patient is an active smoker, does not participate in a regular exercise program. Patient is willing to remain committed for change in life style.  Review of Systems  Constitutional: Negative for fatigue and unexpected weight change.  HENT: Negative for dental problem, mouth sores and trouble swallowing.   Eyes: Negative for visual disturbance.  Respiratory: Negative for cough, choking, chest tightness, shortness of breath and wheezing.   Cardiovascular: Negative for chest pain, palpitations and leg swelling.  Gastrointestinal: Negative for abdominal distention, abdominal pain, constipation, diarrhea, nausea and vomiting.  Endocrine: Negative for polydipsia, polyphagia and polyuria.  Genitourinary: Negative for dysuria, flank pain, hematuria and urgency.  Musculoskeletal: Negative for back pain, gait problem,  myalgias and neck pain.  Skin: Negative for pallor, rash and wound.  Neurological: Negative for seizures, syncope, weakness, numbness and headaches.  Psychiatric/Behavioral: Negative.  Negative for confusion and dysphoric mood.    Objective:    BP 104/70   Ht 5\' 9"  (1.753 m)   Wt 180 lb (81.6 kg)   BMI 26.58 kg/m   Wt Readings from Last 3 Encounters:  04/02/16 180 lb (81.6 kg)  12/15/15 187 lb (84.8 kg)  08/11/15 184 lb (83.5 kg)    Physical Exam  Constitutional: He is oriented to person,  place, and time. He appears well-developed and well-nourished. He is cooperative. No distress.  HENT:  Head: Normocephalic and atraumatic.  Eyes: EOM are normal.  Neck: Normal range of motion. Neck supple. No tracheal deviation present. No thyromegaly present.  Cardiovascular: Normal rate, S1 normal, S2 normal and normal heart sounds.  Exam reveals no gallop.   No murmur heard. Pulses:      Dorsalis pedis pulses are 1+ on the right side, and 1+ on the left side.       Posterior tibial pulses are 1+ on the right side, and 1+ on the left side.  Pulmonary/Chest: Breath sounds normal. No respiratory distress. He has no wheezes.  Abdominal: Soft. Bowel sounds are normal. He exhibits no distension. There is no tenderness. There is no guarding and no CVA tenderness.  Musculoskeletal: He exhibits no edema.       Right shoulder: He exhibits no swelling and no deformity.  Neurological: He is alert and oriented to person, place, and time. He has normal strength and normal reflexes. No cranial nerve deficit or sensory deficit. Gait normal.  Skin: Skin is warm and dry. No rash noted. No cyanosis. Nails show no clubbing.  Psychiatric: He has a normal mood and affect. His speech is normal and behavior is normal. Judgment and thought content normal. Cognition and memory are normal.    Results for orders placed or performed in visit on 03/02/16  COMPLETE METABOLIC PANEL WITH GFR  Result Value Ref Range   Sodium 137 135 - 146 mmol/L   Potassium 4.4 3.5 - 5.3 mmol/L   Chloride 105 98 - 110 mmol/L   CO2 24 20 - 31 mmol/L   Glucose, Bld 132 (H) 65 - 99 mg/dL   BUN 15 7 - 25 mg/dL   Creat 6.31 4.97 - 0.26 mg/dL   Total Bilirubin 0.5 0.2 - 1.2 mg/dL   Alkaline Phosphatase 60 40 - 115 U/L   AST 13 10 - 35 U/L   ALT 11 9 - 46 U/L   Total Protein 6.7 6.1 - 8.1 g/dL   Albumin 4.2 3.6 - 5.1 g/dL   Calcium 9.3 8.6 - 37.8 mg/dL   GFR, Est African American >89 >=60 mL/min   GFR, Est Non African American >89  >=60 mL/min  Microalbumin / creatinine urine ratio  Result Value Ref Range   Creatinine, Urine 153 20 - 370 mg/dL   Microalb, Ur 0.2 Not estab mg/dL   Microalb Creat Ratio 1 <30 mcg/mg creat  Hemoglobin A1c  Result Value Ref Range   Hgb A1c MFr Bld 6.7 (H) <5.7 %   Mean Plasma Glucose 146 mg/dL   Complete Blood Count (Most recent): Lab Results  Component Value Date   HGB 15.7 05/27/2014   Chemistry (most recent): Lab Results  Component Value Date   NA 137 03/02/2016   K 4.4 03/02/2016   CL 105 03/02/2016   CO2  24 03/02/2016   BUN 15 03/02/2016   CREATININE 0.83 03/02/2016     Assessment & Plan:   1. Uncontrolled type 2 diabetes mellitus without complication, with long-term current use of insulin (HCC)  Patient came with Controlled glucose profile averaging 135 or fasting blood glucose readings, and  recent A1c of 6.7 % , generally improved from 12.5%.  Glucose logs and insulin administration records pertaining to this visit,  to be scanned into patient's records.  Recent labs reviewed. - Patient remains at a high risk for more acute and chronic complications of diabetes which include CAD, CVA, CKD, retinopathy, and neuropathy. These are all discussed in detail with the patient.  - I have re-counseled the patient on diet management and by adopting a carbohydrate restricted / protein rich  Diet. - Patient is advised to stick to a routine mealtimes to eat 3 meals  a day and avoid unnecessary snacks ( to snack only to correct hypoglycemia).  - Suggestion is made for patient to avoid simple carbohydrates   from their diet including Cakes , Desserts, Ice Cream,  Soda (  diet and regular) , Sweet Tea , Candies,  Chips, Cookies, Artificial Sweeteners,   and "Sugar-free" Products .  This will help patient to have stable blood glucose profile and potentially avoid unintended  Weight gain.  - The patient  has been  scheduled with Norm SaltPenny Crumpton, RDN, CDE for individualized DM  education. - I have approached patient with the following individualized plan to manage diabetes and patient agrees.  - I will proceed with basal insulinToujeo  20 units QHS, associated with strict monitoring of glucose  Q a.m. -Patient is encouraged to call clinic for blood glucose levels less than 70 or above 300 mg /dl. - I will continue Janumet 50/1000 mg extended release by mouth daily, therapeutically suitable for patient.  - Patient is not a candidate for incretin therapy due to his history of alcohol abuse putting him at risk for pancreatitis. - Patient specific target  for A1c; LDL, HDL, Triglycerides, and  Waist Circumference were discussed in detail.  2) BP/HTN: Controlled. Continue current medications.  3) Lipids/HPL: Recent LDL at above target levels. simvastatin 20 mg by mouth daily at bedtime, continue  4)  Weight/Diet: CDE consult in progress, exercise, and carbohydrates information provided.  5) Chronic Care/Health Maintenance:  -Patient is on  Statin medications and encouraged to continue to follow up with Ophthalmology, Podiatrist at least yearly or according to recommendations, and advised to quit smoking. I have recommended yearly flu vaccine and pneumonia vaccination at least every 5 years; moderate intensity exercise for up to 150 minutes weekly; and  sleep for at least 7 hours a day.  I advised patient to maintain close follow up with his PCP for primary care needs.  Patient is asked to bring meter and  blood glucose logs during their next visit.   Follow up plan: Return in about 3 months (around 07/01/2016) for follow up with pre-visit labs, meter, and logs.  Marquis LunchGebre Didier Brandenburg, MD Phone: [phone]  Fax: [phone]   04/02/2016, 3:49 PM

## 2016-04-21 ENCOUNTER — Other Ambulatory Visit: Payer: Self-pay | Admitting: "Endocrinology

## 2016-06-26 ENCOUNTER — Other Ambulatory Visit: Payer: Self-pay | Admitting: "Endocrinology

## 2016-06-27 LAB — COMPREHENSIVE METABOLIC PANEL
ALBUMIN: 4.3 g/dL (ref 3.6–5.1)
ALK PHOS: 67 U/L (ref 40–115)
ALT: 17 U/L (ref 9–46)
AST: 18 U/L (ref 10–35)
BUN: 9 mg/dL (ref 7–25)
CALCIUM: 9.3 mg/dL (ref 8.6–10.3)
CHLORIDE: 103 mmol/L (ref 98–110)
CO2: 27 mmol/L (ref 20–31)
Creat: 0.72 mg/dL (ref 0.70–1.33)
Glucose, Bld: 106 mg/dL — ABNORMAL HIGH (ref 65–99)
POTASSIUM: 4.2 mmol/L (ref 3.5–5.3)
Sodium: 139 mmol/L (ref 135–146)
TOTAL PROTEIN: 6.7 g/dL (ref 6.1–8.1)
Total Bilirubin: 0.6 mg/dL (ref 0.2–1.2)

## 2016-06-27 LAB — HEMOGLOBIN A1C
Hgb A1c MFr Bld: 6.9 % — ABNORMAL HIGH (ref <5.7)
MEAN PLASMA GLUCOSE: 151 mg/dL

## 2016-07-04 ENCOUNTER — Ambulatory Visit (INDEPENDENT_AMBULATORY_CARE_PROVIDER_SITE_OTHER): Payer: BLUE CROSS/BLUE SHIELD | Admitting: "Endocrinology

## 2016-07-04 ENCOUNTER — Encounter: Payer: Self-pay | Admitting: "Endocrinology

## 2016-07-04 VITALS — BP 114/78 | HR 75 | Ht 69.0 in | Wt 182.0 lb

## 2016-07-04 DIAGNOSIS — E782 Mixed hyperlipidemia: Secondary | ICD-10-CM | POA: Diagnosis not present

## 2016-07-04 DIAGNOSIS — E1165 Type 2 diabetes mellitus with hyperglycemia: Secondary | ICD-10-CM

## 2016-07-04 DIAGNOSIS — IMO0001 Reserved for inherently not codable concepts without codable children: Secondary | ICD-10-CM

## 2016-07-04 DIAGNOSIS — Z794 Long term (current) use of insulin: Secondary | ICD-10-CM | POA: Diagnosis not present

## 2016-07-04 NOTE — Progress Notes (Signed)
Subjective:    Patient ID: Peter Simmons, male    DOB: 23-Jan-1961,    Past Medical History:  Diagnosis Date  . Adhesive capsulitis of right shoulder 05/27/2014  . Diabetes mellitus without complication (HCC)   . Hyperlipemia   . Impingement syndrome of right shoulder 05/27/2014  . Wears glasses    Past Surgical History:  Procedure Laterality Date  . CHOLECYSTECTOMY  2007   hernia fixed also  . HERNIA REPAIR  2007   done with lap choli  . KNEE ARTHROSCOPY     rightx2  . KNEE ARTHROSCOPY     left x1  . SHOULDER ARTHROSCOPY WITH SUBACROMIAL DECOMPRESSION Right 05/27/2014   Procedure: RIGHT SHOULDER ARTHROSCOPY WITH DEBRIDEMENT, ACROMIOPLASTY ;  Surgeon: Eulas Post, MD;  Location: [facility];  Service: Orthopedics;  Laterality: Right;   Social History   Social History  . Marital status: Married    Spouse name: N/A  . Number of children: N/A  . Years of education: N/A   Social History Main Topics  . Smoking status: Current Every Day Smoker    Packs/day: 0.50  . Smokeless tobacco: Never Used  . Alcohol use No  . Drug use: No  . Sexual activity: Not Asked   Other Topics Concern  . None   Social History Narrative  . None   Outpatient Encounter Prescriptions as of 07/04/2016  Medication Sig  . FREESTYLE LITE test strip USE TO TEST BLOOD SUGAR 4 TIMES A DAY  . Insulin Pen Needle (PEN NEEDLES) 32G X 6 MM MISC 1 each by Does not apply route 4 (four) times daily.  Marland Kitchen JANUMET XR 50-1000 MG TB24 TAKE 1 TABLET TWICE A DAY  . Omega-3 Fatty Acids (FISH OIL) 1200 MG CAPS Take by mouth.  . simvastatin (ZOCOR) 20 MG tablet Take 20 mg by mouth at bedtime.  Peter Simmons SOLOSTAR 300 UNIT/ML SOPN INJECT 30 UNITS INTO THE SKIN AT BEDTIME. (Patient taking differently: INJECT 20 UNITS INTO THE SKIN AT BEDTIME.)  . TRESIBA FLEXTOUCH 100 UNIT/ML SOPN FlexTouch Pen INJECT 30 UNITS INTO THE SKIN AT BEDTIME.   No facility-administered encounter medications on file as of  07/04/2016.    ALLERGIES: No Known Allergies VACCINATION STATUS:  There is no immunization history on file for this patient.  HPI  56- yr- old male patient with medical problems as follows. Patient is here to f/u for uncontrolled type 2 DM. Pt is on basal insulin and Janumet 50/1000mg  ER once a day. He came with Stable A1c of 6.9 %, generally improving from  12.5% . He feels stronger, he documented near target fasting glucose profile.  He denies polyuria, polydipsia.  Patient was diagnosed with type 2 DM approx. 12 yrs ago, likely due to ETOH abuse. Has been overweight several years ago, and has abused ETOH. Patient denies history of CAD, CVA, CKD, Neuropathy, and Retinopathy. Patient is an active smoker, does not participate in a regular exercise program. Patient is willing to remain committed for change in life style.  Review of Systems  Constitutional: Negative for fatigue and unexpected weight change.  HENT: Negative for dental problem, mouth sores and trouble swallowing.   Eyes: Negative for visual disturbance.  Respiratory: Negative for cough, choking, chest tightness, shortness of breath and wheezing.   Cardiovascular: Negative for chest pain, palpitations and leg swelling.  Gastrointestinal: Negative for abdominal distention, abdominal pain, constipation, diarrhea, nausea and vomiting.  Endocrine: Negative for polydipsia, polyphagia and polyuria.  Genitourinary: Negative for dysuria, flank pain, hematuria and urgency.  Musculoskeletal: Negative for back pain, gait problem, myalgias and neck pain.  Skin: Negative for pallor, rash and wound.  Neurological: Negative for seizures, syncope, weakness, numbness and headaches.  Psychiatric/Behavioral: Negative.  Negative for confusion and dysphoric mood.    Objective:    BP 114/78   Pulse 75   Ht 5\' 9"  (1.753 m)   Wt 182 lb (82.6 kg)   BMI 26.88 kg/m   Wt Readings from Last 3 Encounters:  07/04/16 182 lb (82.6 kg)  04/02/16  180 lb (81.6 kg)  12/15/15 187 lb (84.8 kg)    Physical Exam  Constitutional: He is oriented to person, place, and time. He appears well-developed and well-nourished. He is cooperative. No distress.  HENT:  Head: Normocephalic and atraumatic.  Eyes: EOM are normal.  Neck: Normal range of motion. Neck supple. No tracheal deviation present. No thyromegaly present.  Cardiovascular: Normal rate, S1 normal, S2 normal and normal heart sounds.  Exam reveals no gallop.   No murmur heard. Pulses:      Dorsalis pedis pulses are 1+ on the right side, and 1+ on the left side.       Posterior tibial pulses are 1+ on the right side, and 1+ on the left side.  Pulmonary/Chest: Breath sounds normal. No respiratory distress. He has no wheezes.  Abdominal: Soft. Bowel sounds are normal. He exhibits no distension. There is no tenderness. There is no guarding and no CVA tenderness.  Musculoskeletal: He exhibits no edema.       Right shoulder: He exhibits no swelling and no deformity.  Neurological: He is alert and oriented to person, place, and time. He has normal strength and normal reflexes. No cranial nerve deficit or sensory deficit. Gait normal.  Skin: Skin is warm and dry. No rash noted. No cyanosis. Nails show no clubbing.  Psychiatric: He has a normal mood and affect. His speech is normal and behavior is normal. Judgment and thought content normal. Cognition and memory are normal.    Results for orders placed or performed in visit on 06/26/16  Comprehensive metabolic panel  Result Value Ref Range   Sodium 139 135 - 146 mmol/L   Potassium 4.2 3.5 - 5.3 mmol/L   Chloride 103 98 - 110 mmol/L   CO2 27 20 - 31 mmol/L   Glucose, Bld 106 (H) 65 - 99 mg/dL   BUN 9 7 - 25 mg/dL   Creat 1.610.72 0.960.70 - 0.451.33 mg/dL   Total Bilirubin 0.6 0.2 - 1.2 mg/dL   Alkaline Phosphatase 67 40 - 115 U/L   AST 18 10 - 35 U/L   ALT 17 9 - 46 U/L   Total Protein 6.7 6.1 - 8.1 g/dL   Albumin 4.3 3.6 - 5.1 g/dL   Calcium  9.3 8.6 - 40.910.3 mg/dL  Hemoglobin W1XA1c  Result Value Ref Range   Hgb A1c MFr Bld 6.9 (H) <5.7 %   Mean Plasma Glucose 151 mg/dL   Complete Blood Count (Most recent): Lab Results  Component Value Date   HGB 15.7 05/27/2014   Chemistry (most recent): Lab Results  Component Value Date   NA 139 06/26/2016   K 4.2 06/26/2016   CL 103 06/26/2016   CO2 27 06/26/2016   BUN 9 06/26/2016   CREATININE 0.72 06/26/2016     Assessment & Plan:   1. Uncontrolled type 2 diabetes mellitus without complication, with long-term current use of insulin (HCC)  Patient came with Controlled glucose profile averaging 135 or fasting blood glucose readings, and  recent A1c of 6.9 % , generally improved from 12.5%.  Glucose logs and insulin administration records pertaining to this visit,  to be scanned into patient's records.  Recent labs reviewed. - Patient remains at a high risk for more acute and chronic complications of diabetes which include CAD, CVA, CKD, retinopathy, and neuropathy. These are all discussed in detail with the patient.  - I have re-counseled the patient on diet management and by adopting a carbohydrate restricted / protein rich  Diet. - Patient is advised to stick to a routine mealtimes to eat 3 meals  a day and avoid unnecessary snacks ( to snack only to correct hypoglycemia).  - Suggestion is made for patient to avoid simple carbohydrates   from their diet including Cakes , Desserts, Ice Cream,  Soda (  diet and regular) , Sweet Tea , Candies,  Chips, Cookies, Artificial Sweeteners,   and "Sugar-free" Products .  This will help patient to have stable blood glucose profile and potentially avoid unintended  Weight gain.  - The patient  has been  scheduled with Norm Salt, RDN, CDE for individualized DM education. - I have approached patient with the following individualized plan to manage diabetes and patient agrees.  - I will proceed with basal insulinToujeo  20 units QHS,  associated with strict monitoring of glucose  Q a.m. -Patient is encouraged to call clinic for blood glucose levels less than 70 or above 300 mg /dl. - I will continue Janumet 50/1000 mg extended release by mouth daily, therapeutically suitable for patient.  - Patient is not a candidate for incretin therapy due to his history of alcohol abuse putting him at risk for pancreatitis. - Patient specific target  for A1c; LDL, HDL, Triglycerides, and  Waist Circumference were discussed in detail.  2) BP/HTN: Controlled. Continue current medications.  3) Lipids/HPL: Recent LDL at above target levels. simvastatin 20 mg by mouth daily at bedtime, continue  4)  Weight/Diet: CDE consult in progress, exercise, and carbohydrates information provided.  5) Chronic Care/Health Maintenance:  -Patient is on  Statin medications and encouraged to continue to follow up with Ophthalmology, Podiatrist at least yearly or according to recommendations, and advised to quit smoking. I have recommended yearly flu vaccine and pneumonia vaccination at least every 5 years; moderate intensity exercise for up to 150 minutes weekly; and  sleep for at least 7 hours a day.  I advised patient to maintain close follow up with his PCP for primary care needs.  Patient is asked to bring meter and  blood glucose logs during their next visit.   Follow up plan: Return in about 4 months (around 11/03/2016) for follow up with pre-visit labs, meter, and logs.  Marquis Lunch, MD Phone: [phone]  Fax: [phone]   07/04/2016, 4:06 PM

## 2016-09-05 ENCOUNTER — Other Ambulatory Visit: Payer: Self-pay | Admitting: "Endocrinology

## 2016-09-18 ENCOUNTER — Other Ambulatory Visit: Payer: Self-pay | Admitting: "Endocrinology

## 2016-10-26 ENCOUNTER — Other Ambulatory Visit: Payer: Self-pay | Admitting: "Endocrinology

## 2016-10-27 LAB — COMPREHENSIVE METABOLIC PANEL
ALK PHOS: 67 U/L (ref 40–115)
ALT: 16 U/L (ref 9–46)
AST: 15 U/L (ref 10–35)
Albumin: 4.4 g/dL (ref 3.6–5.1)
BUN: 9 mg/dL (ref 7–25)
CALCIUM: 9 mg/dL (ref 8.6–10.3)
CO2: 26 mmol/L (ref 20–31)
Chloride: 104 mmol/L (ref 98–110)
Creat: 0.8 mg/dL (ref 0.70–1.33)
Glucose, Bld: 128 mg/dL — ABNORMAL HIGH (ref 65–99)
POTASSIUM: 4.4 mmol/L (ref 3.5–5.3)
Sodium: 139 mmol/L (ref 135–146)
TOTAL PROTEIN: 6.8 g/dL (ref 6.1–8.1)
Total Bilirubin: 0.5 mg/dL (ref 0.2–1.2)

## 2016-10-28 LAB — HEMOGLOBIN A1C
HEMOGLOBIN A1C: 7.6 % — AB (ref <5.7)
Mean Plasma Glucose: 171 mg/dL

## 2016-11-07 ENCOUNTER — Ambulatory Visit (INDEPENDENT_AMBULATORY_CARE_PROVIDER_SITE_OTHER): Payer: BLUE CROSS/BLUE SHIELD | Admitting: "Endocrinology

## 2016-11-07 ENCOUNTER — Encounter: Payer: Self-pay | Admitting: "Endocrinology

## 2016-11-07 VITALS — BP 118/73 | HR 72 | Wt 181.0 lb

## 2016-11-07 DIAGNOSIS — IMO0001 Reserved for inherently not codable concepts without codable children: Secondary | ICD-10-CM

## 2016-11-07 DIAGNOSIS — E1165 Type 2 diabetes mellitus with hyperglycemia: Secondary | ICD-10-CM

## 2016-11-07 DIAGNOSIS — E782 Mixed hyperlipidemia: Secondary | ICD-10-CM

## 2016-11-07 DIAGNOSIS — Z794 Long term (current) use of insulin: Secondary | ICD-10-CM

## 2016-11-07 MED ORDER — DAPAGLIFLOZIN PRO-METFORMIN ER 5-1000 MG PO TB24: 1.0000 | ORAL_TABLET | Freq: Every day | ORAL | 3 refills | Status: DC

## 2016-11-07 NOTE — Progress Notes (Signed)
Subjective:    Patient ID: Peter Simmons, male    DOB: 12/20/1960,    Past Medical History:  Diagnosis Date  . Adhesive capsulitis of right shoulder 05/27/2014  . Diabetes mellitus without complication (HCC)   . Hyperlipemia   . Impingement syndrome of right shoulder 05/27/2014  . Wears glasses    Past Surgical History:  Procedure Laterality Date  . CHOLECYSTECTOMY  2007   hernia fixed also  . HERNIA REPAIR  2007   done with lap choli  . KNEE ARTHROSCOPY     rightx2  . KNEE ARTHROSCOPY     left x1  . SHOULDER ARTHROSCOPY WITH SUBACROMIAL DECOMPRESSION Right 05/27/2014   Procedure: RIGHT SHOULDER ARTHROSCOPY WITH DEBRIDEMENT, ACROMIOPLASTY ;  Surgeon: Eulas PostJoshua P Landau, MD;  Location: [facility];  Service: Orthopedics;  Laterality: Right;   Social History   Social History  . Marital status: Married    Spouse name: N/A  . Number of children: N/A  . Years of education: N/A   Social History Main Topics  . Smoking status: Current Every Day Smoker    Packs/day: 0.50  . Smokeless tobacco: Never Used  . Alcohol use No  . Drug use: No  . Sexual activity: Not Asked   Other Topics Concern  . None   Social History Narrative  . None   Outpatient Encounter Prescriptions as of 11/07/2016  Medication Sig  . Insulin Degludec (TRESIBA FLEXTOUCH [place]) Inject 24 Units into the skin at bedtime.  . Dapagliflozin-Metformin HCl ER (XIGDUO XR) 08-998 MG TB24 Take 1 tablet by mouth daily with breakfast.  . FREESTYLE LITE test strip USE TO TEST BLOOD SUGAR 4 TIMES A DAY  . Insulin Pen Needle (PEN NEEDLES) 32G X 6 MM MISC 1 each by Does not apply route 4 (four) times daily.  . Omega-3 Fatty Acids (FISH OIL) 1200 MG CAPS Take by mouth.  . simvastatin (ZOCOR) 20 MG tablet Take 20 mg by mouth at bedtime.  . [DISCONTINUED] JANUMET XR 50-1000 MG TB24 TAKE 1 TABLET TWICE A DAY  . [DISCONTINUED] TOUJEO SOLOSTAR 300 UNIT/ML SOPN INJECT 30 UNITS INTO THE SKIN AT BEDTIME. (Patient  taking differently: INJECT 20 UNITS INTO THE SKIN AT BEDTIME.)  . [DISCONTINUED] TRESIBA FLEXTOUCH 100 UNIT/ML SOPN FlexTouch Pen INJECT 30 UNITS INTO THE SKIN AT BEDTIME.   No facility-administered encounter medications on file as of 11/07/2016.    ALLERGIES: No Known Allergies VACCINATION STATUS:  There is no immunization history on file for this patient.  HPI  56- yr- old male patient with medical problems as follows. Patient is here to f/u for uncontrolled type 2 DM. Pt is on basal insulin and Janumet 50/1000mg  ER once a day. He came with  Higher A1c of 7.6% increasing from 6.9%, after generally improving from  12.5% . He feels stronger, he documented above target fasting glucose profile.  He denies polyuria, polydipsia.  Patient was diagnosed with type 2 DM approx. 12 yrs ago, likely due to ETOH abuse. Has been overweight several years ago, and has abused ETOH. Patient denies history of CAD, CVA, CKD, Neuropathy, and Retinopathy. Patient is an active smoker, does not participate in a regular exercise program. Patient is willing to remain committed for change in life style.  Review of Systems  Constitutional: Negative for fatigue and unexpected weight change.  HENT: Negative for dental problem, mouth sores and trouble swallowing.   Eyes: Negative for visual disturbance.  Respiratory: Negative for cough, choking,  chest tightness, shortness of breath and wheezing.   Cardiovascular: Negative for chest pain, palpitations and leg swelling.  Gastrointestinal: Negative for abdominal distention, abdominal pain, constipation, diarrhea, nausea and vomiting.  Endocrine: Negative for polydipsia, polyphagia and polyuria.  Genitourinary: Negative for dysuria, flank pain, hematuria and urgency.  Musculoskeletal: Negative for back pain, gait problem, myalgias and neck pain.  Skin: Negative for pallor, rash and wound.  Neurological: Negative for seizures, syncope, weakness, numbness and headaches.   Psychiatric/Behavioral: Negative.  Negative for confusion and dysphoric mood.    Objective:    BP 118/73   Pulse 72   Wt 181 lb (82.1 kg)   BMI 26.73 kg/m   Wt Readings from Last 3 Encounters:  11/07/16 181 lb (82.1 kg)  07/04/16 182 lb (82.6 kg)  04/02/16 180 lb (81.6 kg)    Physical Exam  Constitutional: He is oriented to person, place, and time. He appears well-developed and well-nourished. He is cooperative. No distress.  HENT:  Head: Normocephalic and atraumatic.  Eyes: EOM are normal.  Neck: Normal range of motion. Neck supple. No tracheal deviation present. No thyromegaly present.  Cardiovascular: Normal rate, S1 normal, S2 normal and normal heart sounds.  Exam reveals no gallop.   No murmur heard. Pulses:      Dorsalis pedis pulses are 1+ on the right side, and 1+ on the left side.       Posterior tibial pulses are 1+ on the right side, and 1+ on the left side.  Pulmonary/Chest: Breath sounds normal. No respiratory distress. He has no wheezes.  Abdominal: Soft. Bowel sounds are normal. He exhibits no distension. There is no tenderness. There is no guarding and no CVA tenderness.  Musculoskeletal: He exhibits no edema.       Right shoulder: He exhibits no swelling and no deformity.  Neurological: He is alert and oriented to person, place, and time. He has normal strength and normal reflexes. No cranial nerve deficit or sensory deficit. Gait normal.  Skin: Skin is warm and dry. No rash noted. No cyanosis. Nails show no clubbing.  Psychiatric: He has a normal mood and affect. His speech is normal and behavior is normal. Judgment and thought content normal. Cognition and memory are normal.    Results for orders placed or performed in visit on 10/26/16  Comprehensive metabolic panel  Result Value Ref Range   Sodium 139 135 - 146 mmol/L   Potassium 4.4 3.5 - 5.3 mmol/L   Chloride 104 98 - 110 mmol/L   CO2 26 20 - 31 mmol/L   Glucose, Bld 128 (H) 65 - 99 mg/dL   BUN 9 7  - 25 mg/dL   Creat 1.61 0.96 - 0.45 mg/dL   Total Bilirubin 0.5 0.2 - 1.2 mg/dL   Alkaline Phosphatase 67 40 - 115 U/L   AST 15 10 - 35 U/L   ALT 16 9 - 46 U/L   Total Protein 6.8 6.1 - 8.1 g/dL   Albumin 4.4 3.6 - 5.1 g/dL   Calcium 9.0 8.6 - 40.9 mg/dL  Hemoglobin W1X  Result Value Ref Range   Hgb A1c MFr Bld 7.6 (H) <5.7 %   Mean Plasma Glucose 171 mg/dL   Complete Blood Count (Most recent): Lab Results  Component Value Date   HGB 15.7 05/27/2014   Chemistry (most recent): Lab Results  Component Value Date   NA 139 10/26/2016   K 4.4 10/26/2016   CL 104 10/26/2016   CO2 26 10/26/2016  BUN 9 10/26/2016   CREATININE 0.80 10/26/2016     Assessment & Plan:   1. Uncontrolled type 2 diabetes mellitus without complication, with long-term current use of insulin (HCC)  Patient came with Controlled glucose profile averaging 145  fasting blood glucose readings, and  recent A1c increasing to 7.6% from 6.9 % , generally improved from 12.5%.  Glucose logs and insulin administration records pertaining to this visit,  to be scanned into patient's records.  Recent labs reviewed. - Patient remains at a high risk for more acute and chronic complications of diabetes which include CAD, CVA, CKD, retinopathy, and neuropathy. These are all discussed in detail with the patient.  - I have re-counseled the patient on diet management and by adopting a carbohydrate restricted / protein rich  Diet. - Patient is advised to stick to a routine mealtimes to eat 3 meals  a day and avoid unnecessary snacks ( to snack only to correct hypoglycemia).  - Suggestion is made for patient to avoid simple carbohydrates   from his diet including Cakes , Desserts, Ice Cream,  Soda (  diet and regular) , Sweet Tea , Candies,  Chips, Cookies, Artificial Sweeteners,   and "Sugar-free" Products .  This will help patient to have stable blood glucose profile and potentially avoid unintended  Weight gain.  - The patient   has been  scheduled with Norm SaltPenny Crumpton, RDN, CDE for individualized DM education. - I have approached patient with the following individualized plan to manage diabetes and patient agrees.  - I will increase his  basal insulinTresiba to  24 units daily at bedtime, associated with strict monitoring of glucose daily before breakfast and as needed.   -Patient is encouraged to call clinic for blood glucose levels less than 70 or above 300 mg /dl. -  he would benefit more from combination of metformin and  SGLT2 I, I will switch his  Janumet to Xigduo  08/998 mg ER daily with breakfast . Side effects and precautions discussed with him.   - Patient is not a candidate for incretin therapy due to his history of alcohol abuse putting him at risk for pancreatitis. - Patient specific target  for A1c; LDL, HDL, Triglycerides, and  Waist Circumference were discussed in detail.  2) BP/HTN: Controlled. Continue current medications.  3) Lipids/HPL: Recent LDL at above target levels. simvastatin 20 mg by mouth daily at bedtime, continue  4)  Weight/Diet: CDE consult in progress, exercise, and carbohydrates information provided.  5) Chronic Care/Health Maintenance:  -Patient is on  Statin medications and encouraged to continue to follow up with Ophthalmology, Podiatrist at least yearly or according to recommendations, and advised to quit smoking. I have recommended yearly flu vaccine and pneumonia vaccination at least every 5 years; moderate intensity exercise for up to 150 minutes weekly; and  sleep for at least 7 hours a day.  I advised patient to maintain close follow up with his PCP for primary care needs.  Patient is asked to bring meter and  blood glucose logs during his next visit.   Follow up plan: Return in about 3 months (around 02/07/2017) for follow up with pre-visit labs, meter, and logs.  Peter LunchGebre Dayveon Halley, MD Phone: [phone]  Fax: [phone]   11/07/2016, 4:06 PM

## 2016-11-07 NOTE — Patient Instructions (Signed)
Advice for weight management -For most of us the best way to lose weight is by diet management. Generally speaking, diet management means restricting carbohydrate consumption to minimum possible (and to unprocessed or minimally processed complex starch) and increasing protein intake (animal or plant source), fruits, and vegetables.  -Sticking to a routine mealtime to eat 3 meals a day and avoiding unnecessary snacks is shown to have a big role in weight control.  -It is better to avoid simple carbohydrates including: Cakes, Desserts, Ice Cream, Soda (diet and regular), Sweet Tea, Candies, Chips, Cookies, Artificial Sweeteners, and "Sugar-free" Products.   -Exercise: 30 minutes a day 3-4 days a week, or 150 minutes a week. Combine stretch, strength, and aerobic activities. You may seek evaluation by your heart doctor prior to initiating exercise if you have high risk for heart disease.  -If you are interested, we can schedule a visit with Peter Simmons, RDN, CDE for individualized nutrition education.  

## 2016-12-03 IMAGING — CR DG SHOULDER 2+V*R*
3 series · 3 of 3 positions shown · non-contrast
Comparison: None.

CLINICAL DATA: Right shoulder pain for 2 years, no known injury

EXAM:
RIGHT SHOULDER - 2+ VIEW

[view not recorded (1 of 3)]
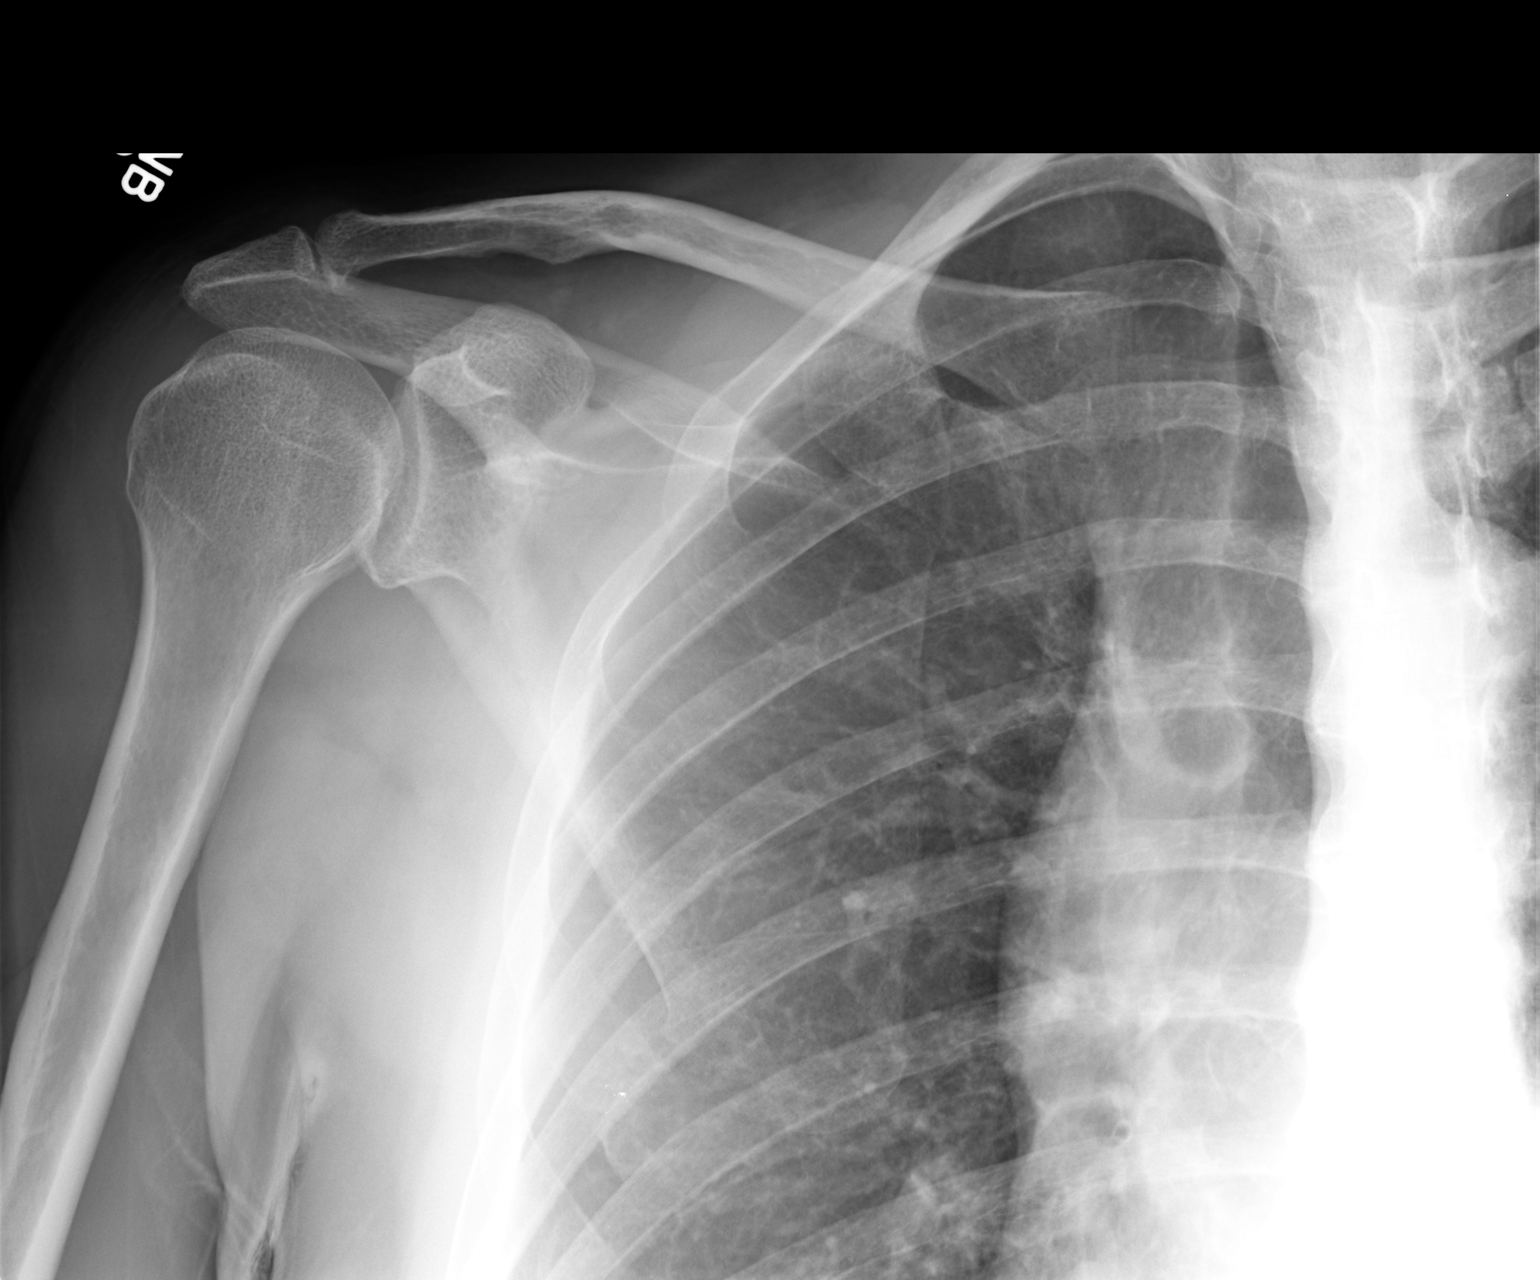

[view not recorded (2 of 3)]
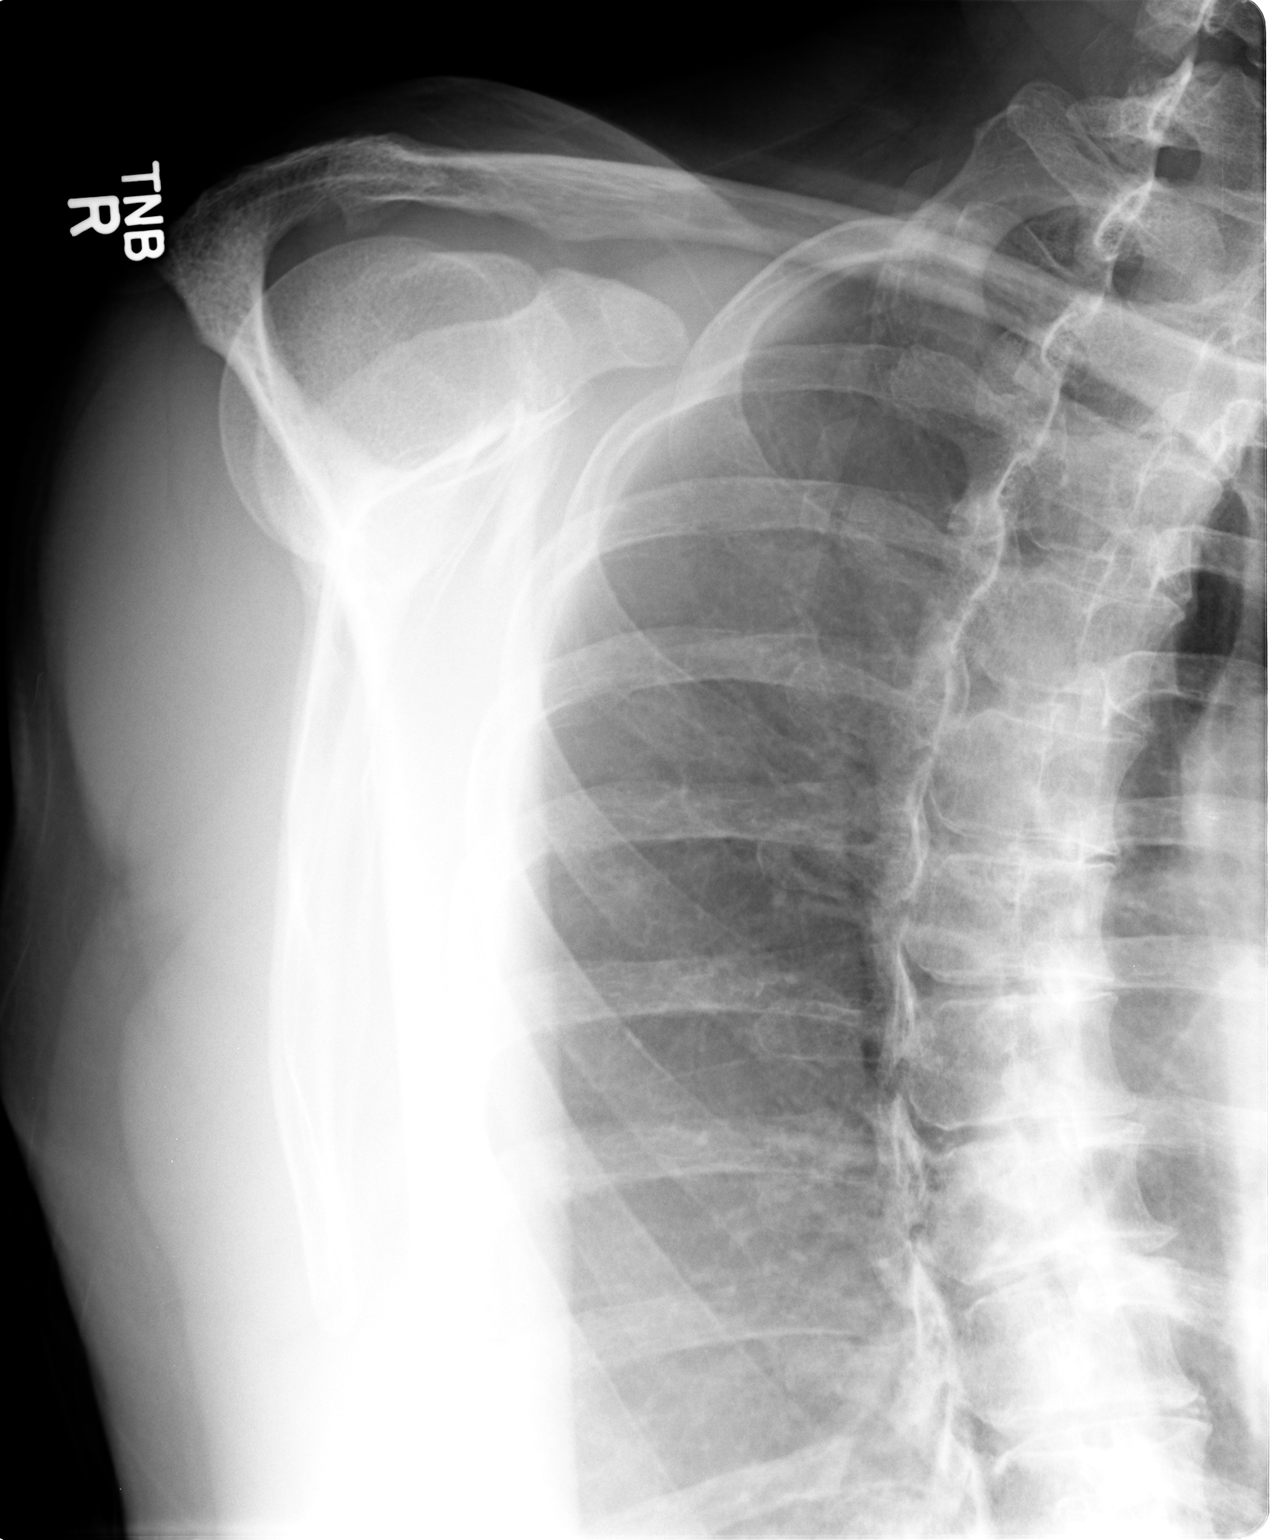

[view not recorded (3 of 3)]
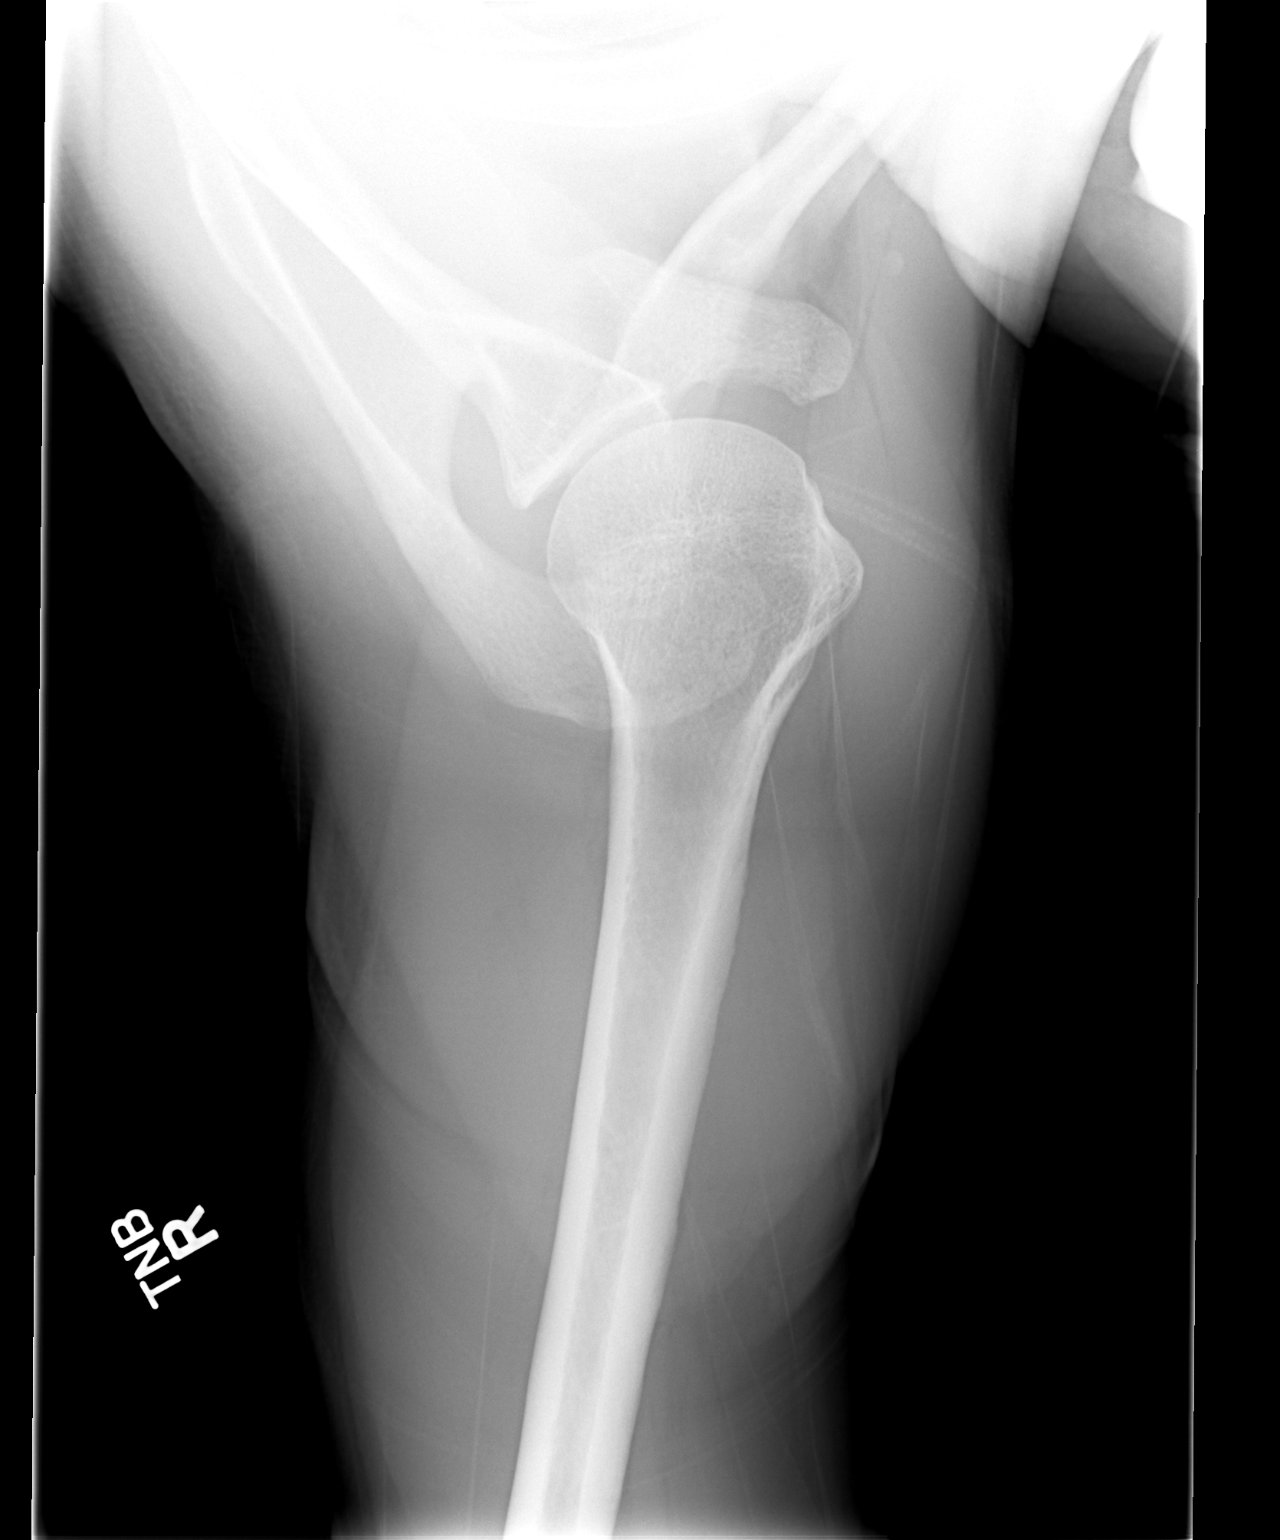

[3 of 3 positions shown; findings below may reference images not displayed]

FINDINGS: Three views of the right shoulder submitted. No acute fracture or
subluxation. Glenohumeral joint is preserved. Mild narrowing of
subacromial space. Moderate degenerative changes AC joint. Mild
spurring of distal clavicle.
IMPRESSION: No acute fracture or subluxation. Osteoarthritic changes as
described above.

## 2016-12-20 ENCOUNTER — Other Ambulatory Visit: Payer: Self-pay | Admitting: "Endocrinology

## 2017-01-10 ENCOUNTER — Other Ambulatory Visit: Payer: Self-pay

## 2017-01-10 MED ORDER — DAPAGLIFLOZIN PRO-METFORMIN ER 5-1000 MG PO TB24: 1.0000 | ORAL_TABLET | Freq: Every day | ORAL | 0 refills | Status: DC

## 2017-01-21 ENCOUNTER — Other Ambulatory Visit: Payer: Self-pay | Admitting: "Endocrinology

## 2017-02-04 LAB — HEMOGLOBIN A1C
HEMOGLOBIN A1C: 7.4 %{Hb} — AB (ref <5.7)
MEAN PLASMA GLUCOSE: 166 (calc)
eAG (mmol/L): 9.2 (calc)

## 2017-02-04 LAB — MICROALBUMIN / CREATININE URINE RATIO
Creatinine, Urine: 53 mg/dL (ref 20–320)
MICROALB/CREAT RATIO: 4 ug/mg{creat} (ref <30)
Microalb, Ur: 0.2 mg/dL

## 2017-02-04 LAB — RENAL FUNCTION PANEL
Albumin: 4 g/dL (ref 3.6–5.1)
BUN: 10 mg/dL (ref 7–25)
CHLORIDE: 106 mmol/L (ref 98–110)
CO2: 26 mmol/L (ref 20–32)
Calcium: 9 mg/dL (ref 8.6–10.3)
Creat: 0.87 mg/dL (ref 0.70–1.33)
Glucose, Bld: 159 mg/dL — ABNORMAL HIGH (ref 65–99)
POTASSIUM: 4 mmol/L (ref 3.5–5.3)
Phosphorus: 2.7 mg/dL (ref 2.5–4.5)
SODIUM: 138 mmol/L (ref 135–146)

## 2017-02-04 LAB — LIPID PANEL
CHOL/HDL RATIO: 3.1 (calc) (ref <5.0)
Cholesterol: 150 mg/dL (ref <200)
HDL: 49 mg/dL (ref >40)
LDL Cholesterol (Calc): 80 mg/dL (calc)
NON-HDL CHOLESTEROL (CALC): 101 mg/dL (ref <130)
Triglycerides: 115 mg/dL (ref <150)

## 2017-02-13 ENCOUNTER — Encounter: Payer: Self-pay | Admitting: "Endocrinology

## 2017-02-13 ENCOUNTER — Ambulatory Visit (INDEPENDENT_AMBULATORY_CARE_PROVIDER_SITE_OTHER): Payer: BLUE CROSS/BLUE SHIELD | Admitting: "Endocrinology

## 2017-02-13 VITALS — BP 132/80 | HR 80 | Ht 69.0 in | Wt 179.0 lb

## 2017-02-13 DIAGNOSIS — Z794 Long term (current) use of insulin: Secondary | ICD-10-CM

## 2017-02-13 DIAGNOSIS — E1165 Type 2 diabetes mellitus with hyperglycemia: Secondary | ICD-10-CM | POA: Diagnosis not present

## 2017-02-13 DIAGNOSIS — E782 Mixed hyperlipidemia: Secondary | ICD-10-CM | POA: Diagnosis not present

## 2017-02-13 DIAGNOSIS — IMO0001 Reserved for inherently not codable concepts without codable children: Secondary | ICD-10-CM

## 2017-02-13 NOTE — Progress Notes (Signed)
Subjective:    Patient ID: Peter Simmons, male    DOB: [birth year],    Past Medical History:  Diagnosis Date  . Adhesive capsulitis of right shoulder 05/27/2014  . Diabetes mellitus without complication (HCC)   . Hyperlipemia   . Impingement syndrome of right shoulder 05/27/2014  . Wears glasses    Past Surgical History:  Procedure Laterality Date  . CHOLECYSTECTOMY  2007   hernia fixed also  . HERNIA REPAIR  2007   done with lap choli  . KNEE ARTHROSCOPY     rightx2  . KNEE ARTHROSCOPY     left x1  . SHOULDER ARTHROSCOPY WITH SUBACROMIAL DECOMPRESSION Right 05/27/2014   Procedure: RIGHT SHOULDER ARTHROSCOPY WITH DEBRIDEMENT, ACROMIOPLASTY ;  Surgeon: Eulas Post, MD;  Location: [facility];  Service: Orthopedics;  Laterality: Right;   Social History   Social History  . Marital status: Married    Spouse name: N/A  . Number of children: N/A  . Years of education: N/A   Social History Main Topics  . Smoking status: Current Every Day Smoker    Packs/day: 0.50  . Smokeless tobacco: Never Used  . Alcohol use No  . Drug use: No  . Sexual activity: Not Asked   Other Topics Concern  . None   Social History Narrative  . None   Outpatient Encounter Prescriptions as of 02/13/2017  Medication Sig  . B-D UF III MINI PEN NEEDLES 31G X 5 MM MISC USE AS DIRECTED 4 TIMES A DAY  . Dapagliflozin-Metformin HCl ER (XIGDUO XR) 08-998 MG TB24 Take 1 tablet by mouth daily with breakfast.  . FREESTYLE LITE test strip USE TO TEST BLOOD SUGAR 4 TIMES A DAY  . Insulin Degludec (TRESIBA FLEXTOUCH [place]) Inject 24 Units into the skin at bedtime.  . Omega-3 Fatty Acids (FISH OIL) 1200 MG CAPS Take by mouth.  . simvastatin (ZOCOR) 40 MG tablet TAKE 1 TABLET AT BEDTIME  . TRESIBA FLEXTOUCH 100 UNIT/ML SOPN FlexTouch Pen INJECT 30 UNITS INTO THE SKIN AT BEDTIME.  . [DISCONTINUED] simvastatin (ZOCOR) 20 MG tablet Take 20 mg by mouth at bedtime.   No facility-administered  encounter medications on file as of 02/13/2017.    ALLERGIES: No Known Allergies VACCINATION STATUS:  There is no immunization history on file for this patient.  HPI  56- yr- old male patient with medical problems as follows. Patient is here to f/u for uncontrolled type 2 DM. Pt is on basal insulin and  Xigduo  5/1000mg  ER once a day. He came with  stable A1c of 7.4% ,  after generally improving from  12.5% . He feels stronger, he documented above target fasting glucose profile.  He denies polyuria, polydipsia.  Patient was diagnosed with type 2 DM approx. 12 yrs ago, likely due to ETOH abuse. Has been overweight several years ago, and has abused ETOH. Patient denies history of CAD, CVA, CKD, Neuropathy, and Retinopathy. Patient is an active smoker, does not participate in a regular exercise program. Patient is willing to remain committed for change in life style.  Review of Systems  Constitutional: Negative for fatigue and unexpected weight change.  HENT: Negative for dental problem, mouth sores and trouble swallowing.   Eyes: Negative for visual disturbance.  Respiratory: Negative for cough, choking, chest tightness, shortness of breath and wheezing.   Cardiovascular: Negative for chest pain, palpitations and leg swelling.  Gastrointestinal: Negative for abdominal distention, abdominal pain, constipation, diarrhea, nausea and vomiting.  Endocrine: Negative for polydipsia, polyphagia and polyuria.  Genitourinary: Negative for dysuria, flank pain, hematuria and urgency.  Musculoskeletal: Negative for back pain, gait problem, myalgias and neck pain.  Skin: Negative for pallor, rash and wound.  Neurological: Negative for seizures, syncope, weakness, numbness and headaches.  Psychiatric/Behavioral: Negative.  Negative for confusion and dysphoric mood.    Objective:    BP 132/80   Pulse 80   Ht 5\' 9"  (1.753 m)   Wt 179 lb (81.2 kg)   BMI 26.43 kg/m   Wt Readings from Last 3  Encounters:  02/13/17 179 lb (81.2 kg)  11/07/16 181 lb (82.1 kg)  07/04/16 182 lb (82.6 kg)    Physical Exam  Constitutional: He is oriented to person, place, and time. He appears well-developed and well-nourished. He is cooperative. No distress.  HENT:  Head: Normocephalic and atraumatic.  Eyes: EOM are normal.  Neck: Normal range of motion. Neck supple. No tracheal deviation present. No thyromegaly present.  Cardiovascular: Normal rate, S1 normal, S2 normal and normal heart sounds.  Exam reveals no gallop.   No murmur heard. Pulses:      Dorsalis pedis pulses are 1+ on the right side, and 1+ on the left side.       Posterior tibial pulses are 1+ on the right side, and 1+ on the left side.  Pulmonary/Chest: Breath sounds normal. No respiratory distress. He has no wheezes.  Abdominal: Soft. Bowel sounds are normal. He exhibits no distension. There is no tenderness. There is no guarding and no CVA tenderness.  Musculoskeletal: He exhibits no edema.       Right shoulder: He exhibits no swelling and no deformity.  Neurological: He is alert and oriented to person, place, and time. He has normal strength and normal reflexes. No cranial nerve deficit or sensory deficit. Gait normal.  Skin: Skin is warm and dry. No rash noted. No cyanosis. Nails show no clubbing.  Psychiatric: He has a normal mood and affect. His speech is normal and behavior is normal. Judgment and thought content normal. Cognition and memory are normal.    Recent Results (from the past 2160 hour(s))  Renal function panel     Status: Abnormal   Collection Time: 02/01/17  8:22 AM  Result Value Ref Range   Glucose, Bld 159 (H) 65 - 99 mg/dL    Comment: .            Fasting reference interval . For someone without known diabetes, a glucose value >125 mg/dL indicates that they may have diabetes and this should be confirmed with a follow-up test. .    BUN 10 7 - 25 mg/dL   Creat 1.61 0.96 - 0.45 mg/dL    Comment: For  patients >68 years of age, the reference limit for Creatinine is approximately 13% higher for people identified as African-American. .    BUN/Creatinine Ratio NOT APPLICABLE 6 - 22 (calc)   Sodium 138 135 - 146 mmol/L   Potassium 4.0 3.5 - 5.3 mmol/L   Chloride 106 98 - 110 mmol/L   CO2 26 20 - 32 mmol/L   Calcium 9.0 8.6 - 10.3 mg/dL   Phosphorus 2.7 2.5 - 4.5 mg/dL   Albumin 4.0 3.6 - 5.1 g/dL  Hemoglobin W0J     Status: Abnormal   Collection Time: 02/01/17  8:22 AM  Result Value Ref Range   Hgb A1c MFr Bld 7.4 (H) <5.7 % of total Hgb    Comment: For someone without  known diabetes, a hemoglobin A1c value of 6.5% or greater indicates that they may have  diabetes and this should be confirmed with a follow-up  test. . For someone with known diabetes, a value <7% indicates  that their diabetes is well controlled and a value  greater than or equal to 7% indicates suboptimal  control. A1c targets should be individualized based on  duration of diabetes, age, comorbid conditions, and  other considerations. . Currently, no consensus exists regarding use of hemoglobin A1c for diagnosis of diabetes for children. .    Mean Plasma Glucose 166 (calc)   eAG (mmol/L) 9.2 (calc)  Microalbumin / creatinine urine ratio     Status: None   Collection Time: 02/01/17  8:22 AM  Result Value Ref Range   Creatinine, Urine 53 20 - 320 mg/dL   Microalb, Ur 0.2 mg/dL    Comment: Reference Range Not established    Microalb Creat Ratio 4 <30 mcg/mg creat    Comment: . The ADA defines abnormalities in albumin excretion as follows: Marland Kitchen. Category         Result (mcg/mg creatinine) . Normal                    <30 Microalbuminuria         30-299  Clinical albuminuria   > OR = 300 . The ADA recommends that at least two of three specimens collected within a 3-6 month period be abnormal before considering a patient to be within a diagnostic category.   Lipid panel     Status: None   Collection  Time: 02/01/17  8:22 AM  Result Value Ref Range   Cholesterol 150 <200 mg/dL   HDL 49 >25>40 mg/dL   Triglycerides 366115 <440<150 mg/dL   LDL Cholesterol (Calc) 80 mg/dL (calc)    Comment: Reference range: <100 . Desirable range <100 mg/dL for primary prevention;   <70 mg/dL for patients with CHD or diabetic patients  with > or = 2 CHD risk factors. Marland Kitchen. LDL-C is now calculated using the Martin-Hopkins  calculation, which is a validated novel method providing  better accuracy than the Friedewald equation in the  estimation of LDL-C.  Horald PollenMartin SS et al. Lenox AhrJAMA. 3474;259(562013;310(19): 2061-2068  (http://education.[facility].com/faq/FAQ164)    Total CHOL/HDL Ratio 3.1 <5.0 (calc)   Non-HDL Cholesterol (Calc) 101 <130 mg/dL (calc)    Comment: For patients with diabetes plus 1 major ASCVD risk  factor, treating to a non-HDL-C goal of <100 mg/dL  (LDL-C of <38<70 mg/dL) is considered a therapeutic  option.      Assessment & Plan:   1. Uncontrolled type 2 diabetes mellitus without complication, with long-term current use of insulin (HCC)  Patient came with controlled glucose profile averaging 155  fasting blood glucose readings, and  recent A1c  staying stable at 7.4%,  generally improved from 12.5%.  Glucose logs and insulin administration records pertaining to this visit,  to be scanned into patient's records.  Recent labs reviewed. - Patient remains at a high risk for more acute and chronic complications of diabetes which include CAD, CVA, CKD, retinopathy, and neuropathy. These are all discussed in detail with the patient.  - I have re-counseled the patient on diet management and by adopting a carbohydrate restricted / protein rich  Diet. - Patient is advised to stick to a routine mealtimes to eat 3 meals  a day and avoid unnecessary snacks ( to snack only to correct hypoglycemia).  -  Suggestion is  made for him to avoid simple carbohydrates  from his diet including Cakes, Sweet Desserts / Pastries,  Ice Cream, Soda (diet and regular), Sweet Tea, Candies, Chips, Cookies, Store Bought Juices, Alcohol in Excess of  1-2 drinks a day, Artificial Sweeteners, and "Sugar-free" Products. This will help patient to have stable blood glucose profile and potentially avoid unintended weight gain.  - I have approached patient with the following individualized plan to manage diabetes and patient agrees.  - I will increase his  basal insulinTresiba to  30 units daily at bedtime, associated with strict monitoring of glucose daily before breakfast and as needed.   -Patient is encouraged to call clinic for blood glucose levels less than 70 or above 300 mg /dl. -  I advised him to continue Xigduo  08/998 mg ER daily with breakfast . Once he finishes his current supplies, he would be switched back to generate. Side effects and precautions discussed with him.   - Patient is not a candidate for incretin therapy due to his history of alcohol abuse putting him at risk for pancreatitis. - Patient specific target  for A1c; LDL, HDL, Triglycerides, and  Waist Circumference were discussed in detail.  2) BP/HTN: Controlled. I advised him to continue his current medications. 3) Lipids/HPL: Recent LDL at above target levels. simvastatin 20 mg by mouth daily at bedtime, continue  4)  Weight/Diet: CDE consult in progress, exercise, and carbohydrates information provided.  5) Chronic Care/Health Maintenance:  -Patient is on  Statin medications and encouraged to continue to follow up with Ophthalmology, Podiatrist at least yearly or according to recommendations, and advised to quit smoking. I have recommended yearly flu vaccine and pneumonia vaccination at least every 5 years; moderate intensity exercise for up to 150 minutes weekly; and  sleep for at least 7 hours a day.  I advised patient to maintain close follow up with his PCP for primary care needs.  - Time spent with the patient: 25 min, of which >50% was spent in  reviewing his sugar logs , discussing his hypo- and hyper-glycemic episodes, reviewing his current and  previous labs and insulin doses and developing a plan to avoid hypo- and hyper-glycemia.  .   Follow up plan: Return in about 4 months (around 06/13/2017) for meter, and logs.  Marquis Lunch, MD Phone: [phone]  Fax: [phone]  -  This note was partially dictated with voice recognition software. Similar sounding words can be transcribed inadequately or may not  be corrected upon review.  02/13/2017, 3:59 PM

## 2017-02-13 NOTE — Patient Instructions (Signed)

## 2017-03-20 ENCOUNTER — Other Ambulatory Visit: Payer: Self-pay | Admitting: "Endocrinology

## 2017-04-26 ENCOUNTER — Other Ambulatory Visit: Payer: Self-pay | Admitting: "Endocrinology

## 2017-06-09 LAB — COMPLETE METABOLIC PANEL WITH GFR
AG Ratio: 1.7 (calc) (ref 1.0–2.5)
ALBUMIN MSPROF: 4 g/dL (ref 3.6–5.1)
ALT: 20 U/L (ref 9–46)
AST: 14 U/L (ref 10–35)
Alkaline phosphatase (APISO): 60 U/L (ref 40–115)
BUN / CREAT RATIO: 16 (calc) (ref 6–22)
BUN: 11 mg/dL (ref 7–25)
CALCIUM: 9.2 mg/dL (ref 8.6–10.3)
CO2: 25 mmol/L (ref 20–32)
CREATININE: 0.67 mg/dL — AB (ref 0.70–1.33)
Chloride: 107 mmol/L (ref 98–110)
GFR, EST AFRICAN AMERICAN: 124 mL/min/{1.73_m2} (ref >=60)
GFR, EST NON AFRICAN AMERICAN: 107 mL/min/{1.73_m2} (ref >=60)
GLOBULIN: 2.4 g/dL (ref 1.9–3.7)
GLUCOSE: 119 mg/dL — AB (ref 65–99)
Potassium: 4.2 mmol/L (ref 3.5–5.3)
SODIUM: 141 mmol/L (ref 135–146)
TOTAL PROTEIN: 6.4 g/dL (ref 6.1–8.1)
Total Bilirubin: 0.5 mg/dL (ref 0.2–1.2)

## 2017-06-09 LAB — HEMOGLOBIN A1C
Hgb A1c MFr Bld: 7.9 % of total Hgb — ABNORMAL HIGH (ref <5.7)
Mean Plasma Glucose: 180 (calc)
eAG (mmol/L): 10 (calc)

## 2017-06-18 ENCOUNTER — Other Ambulatory Visit: Payer: Self-pay | Admitting: "Endocrinology

## 2017-06-19 ENCOUNTER — Encounter: Payer: Self-pay | Admitting: "Endocrinology

## 2017-06-19 ENCOUNTER — Ambulatory Visit (INDEPENDENT_AMBULATORY_CARE_PROVIDER_SITE_OTHER): Payer: BLUE CROSS/BLUE SHIELD | Admitting: "Endocrinology

## 2017-06-19 VITALS — BP 134/84 | HR 84 | Ht 69.0 in | Wt 185.0 lb

## 2017-06-19 DIAGNOSIS — Z794 Long term (current) use of insulin: Secondary | ICD-10-CM | POA: Diagnosis not present

## 2017-06-19 DIAGNOSIS — E782 Mixed hyperlipidemia: Secondary | ICD-10-CM | POA: Diagnosis not present

## 2017-06-19 DIAGNOSIS — IMO0001 Reserved for inherently not codable concepts without codable children: Secondary | ICD-10-CM

## 2017-06-19 DIAGNOSIS — E1165 Type 2 diabetes mellitus with hyperglycemia: Secondary | ICD-10-CM

## 2017-06-19 NOTE — Progress Notes (Signed)
Subjective:    Patient ID: Peter Simmons, adult    DOB: [birth year],    Past Medical History:  Diagnosis Date  . Adhesive capsulitis of right shoulder 05/27/2014  . Diabetes mellitus without complication (HCC)   . Hyperlipemia   . Impingement syndrome of right shoulder 05/27/2014  . Wears glasses    Past Surgical History:  Procedure Laterality Date  . CHOLECYSTECTOMY  2007   hernia fixed also  . HERNIA REPAIR  2007   done with lap choli  . KNEE ARTHROSCOPY     rightx2  . KNEE ARTHROSCOPY     left x1  . SHOULDER ARTHROSCOPY WITH SUBACROMIAL DECOMPRESSION Right 05/27/2014   Procedure: RIGHT SHOULDER ARTHROSCOPY WITH DEBRIDEMENT, ACROMIOPLASTY ;  Surgeon: Eulas Post, MD;  Location: [facility];  Service: Orthopedics;  Laterality: Right;   Social History   Socioeconomic History  . Marital status: Married    Spouse name: None  . Number of children: None  . Years of education: None  . Highest education level: None  Social Needs  . Financial resource strain: None  . Food insecurity - worry: None  . Food insecurity - inability: None  . Transportation needs - medical: None  . Transportation needs - non-medical: None  Occupational History  . None  Tobacco Use  . Smoking status: Current Every Day Smoker    Packs/day: 0.50  . Smokeless tobacco: Never Used  Substance and Sexual Activity  . Alcohol use: No  . Drug use: No  . Sexual activity: None  Other Topics Concern  . None  Social History Narrative  . None   Outpatient Encounter Medications as of 06/19/2017  Medication Sig  . SitaGLIPtin-MetFORMIN HCl 50-1000 MG TB24 Take 1 tablet by mouth daily after breakfast.  . B-D UF III MINI PEN NEEDLES 31G X 5 MM MISC USE AS DIRECTED 4 TIMES A DAY  . FREESTYLE LITE test strip USE TO TEST BLOOD SUGAR 4 TIMES A DAY  . Insulin Degludec (TRESIBA FLEXTOUCH ) Inject 40 Units into the skin at bedtime.  . Omega-3 Fatty Acids (FISH OIL) 1200 MG CAPS Take by mouth.   . simvastatin (ZOCOR) 40 MG tablet TAKE 1 TABLET AT BEDTIME  . TRESIBA FLEXTOUCH 100 UNIT/ML SOPN FlexTouch Pen INJECT 30 UNITS INTO THE SKIN AT BEDTIME.  . [DISCONTINUED] Dapagliflozin-Metformin HCl ER (XIGDUO XR) 08-998 MG TB24 Take 1 tablet by mouth daily with breakfast.   No facility-administered encounter medications on file as of 06/19/2017.    ALLERGIES: No Known Allergies VACCINATION STATUS:  There is no immunization history on file for this patient.  HPI  57- yr- old male patient with medical problems as follows. Patient is here to f/u for uncontrolled type 2 DM. Pt is on basal insulin and  Xigduo  5/1000mg  ER once a day. He came with  stable A1c of 7.4% ,  after generally improving from  12.5% . He feels stronger, he documented above target fasting glucose profile.  He denies polyuria, polydipsia.  Patient was diagnosed with type 2 DM approx. 12 yrs ago, likely due to ETOH abuse. Has been overweight several years ago, and has abused ETOH. Patient denies history of CAD, CVA, CKD, Neuropathy, and Retinopathy. Patient is an active smoker, does not participate in a regular exercise program. Patient is willing to remain committed for change in life style.  Review of Systems  Constitutional: Negative for fatigue and unexpected weight change.  HENT: Negative for dental problem,  mouth sores and trouble swallowing.   Eyes: Negative for visual disturbance.  Respiratory: Negative for cough, choking, chest tightness, shortness of breath and wheezing.   Cardiovascular: Negative for chest pain, palpitations and leg swelling.  Gastrointestinal: Negative for abdominal distention, abdominal pain, constipation, diarrhea, nausea and vomiting.  Endocrine: Negative for polydipsia, polyphagia and polyuria.  Genitourinary: Negative for dysuria, flank pain, hematuria and urgency.  Musculoskeletal: Negative for back pain, gait problem, myalgias and neck pain.  Skin: Negative for pallor, rash and  wound.  Neurological: Negative for seizures, syncope, weakness, numbness and headaches.  Psychiatric/Behavioral: Negative.  Negative for confusion and dysphoric mood.    Objective:    BP 134/84   Pulse 84   Ht 5\' 9"  (1.753 m)   Wt 185 lb (83.9 kg)   BMI 27.32 kg/m   Wt Readings from Last 3 Encounters:  06/19/17 185 lb (83.9 kg)  02/13/17 179 lb (81.2 kg)  11/07/16 181 lb (82.1 kg)    Physical Exam  Constitutional: He is oriented to person, place, and time. He appears well-developed and well-nourished. He is cooperative. No distress.  HENT:  Head: Normocephalic and atraumatic.  Eyes: EOM are normal.  Neck: Normal range of motion. Neck supple. No tracheal deviation present. No thyromegaly present.  Cardiovascular: Normal rate, S1 normal, S2 normal and normal heart sounds. Exam reveals no gallop.  No murmur heard. Pulses:      Dorsalis pedis pulses are 1+ on the right side, and 1+ on the left side.       Posterior tibial pulses are 1+ on the right side, and 1+ on the left side.  Pulmonary/Chest: Breath sounds normal. No respiratory distress. He has no wheezes.  Abdominal: Soft. Bowel sounds are normal. He exhibits no distension. There is no tenderness. There is no guarding and no CVA tenderness.  Musculoskeletal: He exhibits no edema.       Right shoulder: He exhibits no swelling and no deformity.  Neurological: He is alert and oriented to person, place, and time. He has normal strength and normal reflexes. No cranial nerve deficit or sensory deficit. Gait normal.  Skin: Skin is warm and dry. No rash noted. No cyanosis. Nails show no clubbing.  Psychiatric: He has a normal mood and affect. His speech is normal and behavior is normal. Judgment and thought content normal. Cognition and memory are normal.    Recent Results (from the past 2160 hour(s))  COMPLETE METABOLIC PANEL WITH GFR     Status: Abnormal   Collection Time: 06/07/17  8:18 AM  Result Value Ref Range   Glucose, Bld  119 (H) 65 - 99 mg/dL    Comment: .            Fasting reference interval . For someone without known diabetes, a glucose value between 100 and 125 mg/dL is consistent with prediabetes and should be confirmed with a follow-up test. .    BUN 11 7 - 25 mg/dL   Creat 9.60 (L) 4.54 - 1.33 mg/dL    Comment: For patients >107 years of age, the reference limit for Creatinine is approximately 13% higher for people identified as African-American. .    GFR, Est Non African American 107 > OR = 60 mL/min/1.58m2   GFR, Est African American 124 > OR = 60 mL/min/1.98m2   BUN/Creatinine Ratio 16 6 - 22 (calc)   Sodium 141 135 - 146 mmol/L   Potassium 4.2 3.5 - 5.3 mmol/L   Chloride 107 98 - 110  mmol/L   CO2 25 20 - 32 mmol/L   Calcium 9.2 8.6 - 10.3 mg/dL   Total Protein 6.4 6.1 - 8.1 g/dL   Albumin 4.0 3.6 - 5.1 g/dL   Globulin 2.4 1.9 - 3.7 g/dL (calc)   AG Ratio 1.7 1.0 - 2.5 (calc)   Total Bilirubin 0.5 0.2 - 1.2 mg/dL   Alkaline phosphatase (APISO) 60 40 - 115 U/L   AST 14 10 - 35 U/L   ALT 20 9 - 46 U/L  Hemoglobin A1c     Status: Abnormal   Collection Time: 06/07/17  8:18 AM  Result Value Ref Range   Hgb A1c MFr Bld 7.9 (H) <5.7 % of total Hgb    Comment: For someone without known diabetes, a hemoglobin A1c value of 6.5% or greater indicates that they may have  diabetes and this should be confirmed with a follow-up  test. . For someone with known diabetes, a value <7% indicates  that their diabetes is well controlled and a value  greater than or equal to 7% indicates suboptimal  control. A1c targets should be individualized based on  duration of diabetes, age, comorbid conditions, and  other considerations. . Currently, no consensus exists regarding use of hemoglobin A1c for diagnosis of diabetes for children. .    Mean Plasma Glucose 180 (calc)   eAG (mmol/L) 10.0 (calc)     Assessment & Plan:   1. Uncontrolled type 2 diabetes mellitus without complication, with  long-term current use of insulin (HCC)  Patient came with slightly above target fasting blood glucose profile, recent A1c higher at 7.9% compared to 7.4% last visit.  He has improved his A1c from 12.5% overall.     Glucose logs and insulin administration records pertaining to this visit,  to be scanned into patient's records.  Recent labs reviewed. - Patient remains at a high risk for more acute and chronic complications of diabetes which include CAD, CVA, CKD, retinopathy, and neuropathy. These are all discussed in detail with the patient.  - I have re-counseled the patient on diet management and by adopting a carbohydrate restricted / protein rich  Diet. - Patient is advised to stick to a routine mealtimes to eat 3 meals  a day and avoid unnecessary snacks ( to snack only to correct hypoglycemia).   -  Suggestion is made for him to avoid simple carbohydrates  from his diet including Cakes, Sweet Desserts / Pastries, Ice Cream, Soda (diet and regular), Sweet Tea, Candies, Chips, Cookies, Store Bought Juices, Alcohol in Excess of  1-2 drinks a day, Artificial Sweeteners, and "Sugar-free" Products. This will help patient to have stable blood glucose profile and potentially avoid unintended weight gain.   - I have approached patient with the following individualized plan to manage diabetes and patient agrees.  - I will increase his  basal insulinTresiba to  40 units daily at bedtime, associated with strict monitoring of glucose daily before breakfast and as needed.   -Patient is encouraged to call clinic for blood glucose levels less than 70 or above 300 mg /dl. -  I advised him to continue Janumet 50/1000 mg p.o. daily with breakfast. - Patient is not a candidate for incretin therapy due to his history of alcohol abuse putting him at risk for pancreatitis. - Patient specific target  for A1c; LDL, HDL, Triglycerides, and  Waist Circumference were discussed in detail.  2) BP/HTN: His blood  pressure is controlled to target.  I advised him to  continue his current blood pressure medications .  3) Lipids/HPL: Recent LDL at above target levels. simvastatin 20 mg by mouth daily at bedtime, continue  4)  Weight/Diet: CDE consult in progress, exercise, and carbohydrates information provided.  5) Chronic Care/Health Maintenance:  -Patient is on  Statin medications and encouraged to continue to follow up with Ophthalmology, Podiatrist at least yearly or according to recommendations, and advised to quit smoking. I have recommended yearly flu vaccine and pneumonia vaccination at least every 5 years; moderate intensity exercise for up to 150 minutes weekly; and  sleep for at least 7 hours a day.  I advised patient to maintain close follow up with his PCP for primary care needs.  - Time spent with the patient: 25 min, of which >50% was spent in reviewing his blood glucose logs , discussing his hypo- and hyper-glycemic episodes, reviewing his current and  previous labs and insulin doses and developing a plan to avoid hypo- and hyper-glycemia. Please refer to Patient Instructions for Blood Glucose Monitoring and Insulin/Medications Dosing Guide"  in media tab for additional information.   Follow up plan: Return in about 4 months (around 10/19/2017) for meter, and logs.  Marquis Lunch, MD Phone: [phone]  Fax: [phone]  -  This note was partially dictated with voice recognition software. Similar sounding words can be transcribed inadequately or may not  be corrected upon review.  06/19/2017, 5:33 PM

## 2017-06-19 NOTE — Patient Instructions (Signed)

## 2017-07-26 ENCOUNTER — Other Ambulatory Visit: Payer: Self-pay | Admitting: "Endocrinology

## 2017-10-24 ENCOUNTER — Other Ambulatory Visit: Payer: Self-pay | Admitting: "Endocrinology

## 2017-10-30 ENCOUNTER — Ambulatory Visit: Payer: BLUE CROSS/BLUE SHIELD | Admitting: "Endocrinology

## 2017-11-01 LAB — COMPLETE METABOLIC PANEL WITH GFR
AG Ratio: 1.6 (calc) (ref 1.0–2.5)
ALT: 16 U/L (ref 9–46)
AST: 12 U/L (ref 10–35)
Albumin: 4 g/dL (ref 3.6–5.1)
Alkaline phosphatase (APISO): 56 U/L (ref 40–115)
BILIRUBIN TOTAL: 0.4 mg/dL (ref 0.2–1.2)
BUN: 10 mg/dL (ref 7–25)
CHLORIDE: 102 mmol/L (ref 98–110)
CO2: 28 mmol/L (ref 20–32)
Calcium: 9.1 mg/dL (ref 8.6–10.3)
Creat: 0.77 mg/dL (ref 0.70–1.33)
GFR, EST AFRICAN AMERICAN: 118 mL/min/{1.73_m2} (ref >=60)
GFR, Est Non African American: 101 mL/min/{1.73_m2} (ref >=60)
Globulin: 2.5 g/dL (calc) (ref 1.9–3.7)
Glucose, Bld: 287 mg/dL — ABNORMAL HIGH (ref 65–139)
POTASSIUM: 4.4 mmol/L (ref 3.5–5.3)
Sodium: 136 mmol/L (ref 135–146)
TOTAL PROTEIN: 6.5 g/dL (ref 6.1–8.1)

## 2017-11-01 LAB — HEMOGLOBIN A1C
HEMOGLOBIN A1C: 7.8 %{Hb} — AB (ref <5.7)
Mean Plasma Glucose: 177 (calc)
eAG (mmol/L): 9.8 (calc)

## 2017-11-06 ENCOUNTER — Encounter: Payer: Self-pay | Admitting: "Endocrinology

## 2017-11-06 ENCOUNTER — Ambulatory Visit (INDEPENDENT_AMBULATORY_CARE_PROVIDER_SITE_OTHER): Payer: BLUE CROSS/BLUE SHIELD | Admitting: "Endocrinology

## 2017-11-06 VITALS — BP 110/75 | HR 88 | Ht 69.0 in | Wt 181.0 lb

## 2017-11-06 DIAGNOSIS — IMO0001 Reserved for inherently not codable concepts without codable children: Secondary | ICD-10-CM

## 2017-11-06 DIAGNOSIS — E782 Mixed hyperlipidemia: Secondary | ICD-10-CM | POA: Diagnosis not present

## 2017-11-06 DIAGNOSIS — M7541 Impingement syndrome of right shoulder: Secondary | ICD-10-CM | POA: Diagnosis not present

## 2017-11-06 DIAGNOSIS — E1165 Type 2 diabetes mellitus with hyperglycemia: Secondary | ICD-10-CM | POA: Diagnosis not present

## 2017-11-06 DIAGNOSIS — Z794 Long term (current) use of insulin: Secondary | ICD-10-CM

## 2017-11-06 NOTE — Patient Instructions (Signed)

## 2017-11-06 NOTE — Progress Notes (Signed)
Endocrinology follow-up note  Subjective:    Patient ID: Peter Simmons, adult    DOB: 1960/05/16,    Past Medical History:  Diagnosis Date  . Adhesive capsulitis of right shoulder 05/27/2014  . Diabetes mellitus without complication (HCC)   . Hyperlipemia   . Impingement syndrome of right shoulder 05/27/2014  . Wears glasses    Past Surgical History:  Procedure Laterality Date  . CHOLECYSTECTOMY  2007   hernia fixed also  . HERNIA REPAIR  2007   done with lap choli  . KNEE ARTHROSCOPY     rightx2  . KNEE ARTHROSCOPY     left x1  . SHOULDER ARTHROSCOPY WITH SUBACROMIAL DECOMPRESSION Right 05/27/2014   Procedure: RIGHT SHOULDER ARTHROSCOPY WITH DEBRIDEMENT, ACROMIOPLASTY ;  Surgeon: Eulas Post, MD;  Location: [facility];  Service: Orthopedics;  Laterality: Right;   Social History   Socioeconomic History  . Marital status: Married    Spouse name: Not on file  . Number of children: Not on file  . Years of education: Not on file  . Highest education level: Not on file  Occupational History  . Not on file  Social Needs  . Financial resource strain: Not on file  . Food insecurity:    Worry: Not on file    Inability: Not on file  . Transportation needs:    Medical: Not on file    Non-medical: Not on file  Tobacco Use  . Smoking status: Current Every Day Smoker    Packs/day: 0.50  . Smokeless tobacco: Never Used  Substance and Sexual Activity  . Alcohol use: No  . Drug use: No  . Sexual activity: Not on file  Lifestyle  . Physical activity:    Days per week: Not on file    Minutes per session: Not on file  . Stress: Not on file  Relationships  . Social connections:    Talks on phone: Not on file    Gets together: Not on file    Attends religious service: Not on file    Active member of club or organization: Not on file    Attends meetings of clubs or organizations: Not on file    Relationship status: Not on file  Other Topics Concern  . Not  on file  Social History Narrative  . Not on file   Outpatient Encounter Medications as of 57/25/2019  Medication Sig  . B-D UF III MINI PEN NEEDLES 31G X 5 MM MISC USE AS DIRECTED 4 TIMES A DAY  . FREESTYLE LITE test strip USE TO TEST BLOOD SUGAR 4 TIMES A DAY  . Insulin Degludec (TRESIBA FLEXTOUCH [place]) Inject 40 Units into the skin at bedtime.  Marland Kitchen JANUMET XR 50-1000 MG TB24 TAKE 1 TABLET BY MOUTH TWICE A DAY  . Omega-3 Fatty Acids (FISH OIL) 1200 MG CAPS Take by mouth.  . simvastatin (ZOCOR) 40 MG tablet TAKE 1 TABLET AT BEDTIME  . [DISCONTINUED] SitaGLIPtin-MetFORMIN HCl 50-1000 MG TB24 Take 1 tablet by mouth daily after breakfast.  . [DISCONTINUED] TRESIBA FLEXTOUCH 100 UNIT/ML SOPN FlexTouch Pen INJECT 30 UNITS INTO THE SKIN AT BEDTIME.   No facility-administered encounter medications on file as of 11/06/2017.    ALLERGIES: No Known Allergies VACCINATION STATUS:  There is no immunization history on file for this patient.  HPI  57- yr- old male patient with medical problems as follows. Patient is here to f/u for uncontrolled type 2 diabetes.  Patient is on basal insulin  as well as Janumet.   He came with A1c of 7.8%, largely unchanged from his last visit.  In the last 2 weeks he did have 2 episodes of hypoglycemia below 70 mg/dL at fasting. He feels better.  He has more consistent energy.  Patient was diagnosed with type 2 DM approx. 15 yrs ago, likely due to ETOH abuse. Has been overweight several years ago, and has abused ETOH. Patient denies history of CAD, CVA, CKD, Neuropathy, and Retinopathy. Patient is an active smoker, does not participate in a regular exercise program. Patient is willing to remain committed for change in life style.  Review of Systems  Constitutional: Negative for fatigue and unexpected weight change.  HENT: Negative for dental problem, mouth sores and trouble swallowing.   Eyes: Negative for visual disturbance.  Respiratory: Negative for cough, choking,  chest tightness, shortness of breath and wheezing.   Cardiovascular: Negative for chest pain, palpitations and leg swelling.  Gastrointestinal: Negative for abdominal distention, abdominal pain, constipation, diarrhea, nausea and vomiting.  Endocrine: Negative for polydipsia, polyphagia and polyuria.  Genitourinary: Negative for dysuria, flank pain, hematuria and urgency.  Musculoskeletal: Negative for back pain, gait problem, myalgias and neck pain.  Skin: Negative for pallor, rash and wound.  Neurological: Negative for seizures, syncope, weakness, numbness and headaches.  Psychiatric/Behavioral: Negative.  Negative for confusion and dysphoric mood.    Objective:    BP 110/75   Pulse 88   Ht 5\' 9"  (1.753 m)   Wt 181 lb (82.1 kg)   BMI 26.73 kg/m   Wt Readings from Last 3 Encounters:  11/06/17 181 lb (82.1 kg)  06/19/17 185 lb (83.9 kg)  02/13/17 179 lb (81.2 kg)    Physical Exam  Constitutional: He is oriented to person, place, and time. He appears well-developed. He is cooperative. No distress.  HENT:  Head: Normocephalic and atraumatic.  Eyes: EOM are normal.  Neck: Normal range of motion. Neck supple. No tracheal deviation present. No thyromegaly present.  Cardiovascular: Normal rate, S1 normal and S2 normal. Exam reveals no gallop.  No murmur heard. Pulses:      Dorsalis pedis pulses are 1+ on the right side, and 1+ on the left side.       Posterior tibial pulses are 1+ on the right side, and 1+ on the left side.  Pulmonary/Chest: Effort normal. No respiratory distress. He has no wheezes.  Abdominal: He exhibits no distension. There is no tenderness. There is no guarding and no CVA tenderness.  Musculoskeletal: He exhibits no edema.       Right shoulder: He exhibits no swelling and no deformity.  Neurological: He is alert and oriented to person, place, and time. He has normal strength. No cranial nerve deficit or sensory deficit. Gait normal.  Skin: Skin is warm and dry.  No rash noted. No cyanosis. Nails show no clubbing.  Psychiatric: He has a normal mood and affect. His speech is normal and behavior is normal. Judgment and thought content normal. Cognition and memory are normal.    Recent Results (from the past 2160 hour(s))  COMPLETE METABOLIC PANEL WITH GFR     Status: Abnormal   Collection Time: 10/31/17  8:43 AM  Result Value Ref Range   Glucose, Bld 287 (H) 65 - 139 mg/dL    Comment: .        Non-fasting reference interval .    BUN 10 7 - 25 mg/dL   Creat 1.610.77 0.960.70 - 0.451.33 mg/dL  Comment: For patients >28 years of age, the reference limit for Creatinine is approximately 13% higher for people identified as African-American. .    GFR, Est Non African American 101 > OR = 60 mL/min/1.7m2   GFR, Est African American 118 > OR = 60 mL/min/1.70m2   BUN/Creatinine Ratio NOT APPLICABLE 6 - 22 (calc)   Sodium 136 135 - 146 mmol/L   Potassium 4.4 3.5 - 5.3 mmol/L   Chloride 102 98 - 110 mmol/L   CO2 28 20 - 32 mmol/L   Calcium 9.1 8.6 - 10.3 mg/dL   Total Protein 6.5 6.1 - 8.1 g/dL   Albumin 4.0 3.6 - 5.1 g/dL   Globulin 2.5 1.9 - 3.7 g/dL (calc)   AG Ratio 1.6 1.0 - 2.5 (calc)   Total Bilirubin 0.4 0.2 - 1.2 mg/dL   Alkaline phosphatase (APISO) 56 40 - 115 U/L   AST 12 10 - 35 U/L   ALT 16 9 - 46 U/L  Hemoglobin A1c     Status: Abnormal   Collection Time: 10/31/17  8:43 AM  Result Value Ref Range   Hgb A1c MFr Bld 7.8 (H) <5.7 % of total Hgb    Comment: For someone without known diabetes, a hemoglobin A1c value of 6.5% or greater indicates that they may have  diabetes and this should be confirmed with a follow-up  test. . For someone with known diabetes, a value <7% indicates  that their diabetes is well controlled and a value  greater than or equal to 7% indicates suboptimal  control. A1c targets should be individualized based on  duration of diabetes, age, comorbid conditions, and  other considerations. . Currently, no consensus  exists regarding use of hemoglobin A1c for diagnosis of diabetes for children. .    Mean Plasma Glucose 177 (calc)   eAG (mmol/L) 9.8 (calc)   Lipid Panel     Component Value Date/Time   CHOL 150 02/01/2017 0822   TRIG 115 02/01/2017 0822   HDL 49 02/01/2017 0822   CHOLHDL 3.1 02/01/2017 0822   LDLCALC 80 02/01/2017 0822     Assessment & Plan:   1. Uncontrolled type 2 diabetes mellitus without complication, with long-term current use of insulin (HCC)  Patient came with controlled fasting blood glucose profile, A1c of 7.8%.     Glucose logs and insulin administration records pertaining to this visit,  to be scanned into patient's records.  Recent labs reviewed. - Patient remains at a high risk for more acute and chronic complications of diabetes which include CAD, CVA, CKD, retinopathy, and neuropathy. These are all discussed in detail with the patient.  - I have re-counseled the patient on diet management and by adopting a carbohydrate restricted / protein rich  Diet. - Patient is advised to stick to a routine mealtimes to eat 3 meals  a day and avoid unnecessary snacks ( to snack only to correct hypoglycemia).   -  Suggestion is made for him to avoid simple carbohydrates  from his diet including Cakes, Sweet Desserts / Pastries, Ice Cream, Soda (diet and regular), Sweet Tea, Candies, Chips, Cookies, Store Bought Juices, Alcohol in Excess of  1-2 drinks a day, Artificial Sweeteners, and "Sugar-free" Products. This will help patient to have stable blood glucose profile and potentially avoid unintended weight gain.  - I have approached patient with the following individualized plan to manage diabetes and patient agrees.  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 -  Prior to #1 in his diabetes care is to avoid hypoglycemia. - I advised him to continue Tresiba 40 units nightly, associated with strict monitoring of blood glucose  daily fasting and any other time as needed.   -Patient is encouraged to call clinic for blood glucose levels less than 70 or above 300 mg /dl.  -  I advised him to continue Janumet 50/1000 mg p.o. daily with breakfast. - Patient is not a candidate for incretin therapy due to his history of alcohol abuse putting him at risk for pancreatitis. - Patient specific target  for A1c; LDL, HDL, Triglycerides, and  Waist Circumference were discussed in detail.  2) BP/HTN: His blood pressure is controlled to target.  He is not on antihypertensive medications at this time.   3) Lipids/HPL: Recent lipid panel showed controlled LDL at 80.  He is advised to continue simvastatin 20 mg p.o. nightly.    4)  Weight/Diet: CDE consult in progress, exercise, and carbohydrates information provided.  5) Chronic Care/Health Maintenance:  -Patient is on  Statin medications and encouraged to continue to follow up with Ophthalmology, Podiatrist at least yearly or according to recommendations, and advised to quit smoking. I have recommended yearly flu vaccine and pneumonia vaccination at least every 5 years; moderate intensity exercise for up to 150 minutes weekly; and  sleep for at least 7 hours a day.  I advised patient to maintain close follow up with his PCP for primary care needs.  - Time spent with the patient: 25 min, of which >50% was spent in reviewing his blood glucose logs , discussing his hypo- and hyper-glycemic episodes, reviewing his current and  previous labs and insulin doses and developing a plan to avoid hypo- and hyper-glycemia. Please refer to Patient Instructions for Blood Glucose Monitoring and Insulin/Medications Dosing Guide"  in media tab for additional information. Peter Simmons participated in the discussions, expressed understanding, and voiced agreement with the above plans.  All questions were answered to his satisfaction. he is encouraged to contact clinic should he have any questions or  concerns prior to his return visit.   Follow up plan: Return in about 6 months (around 05/09/2018).  Marquis Lunch, MD Phone: [phone]  Fax: [phone]  -  This note was partially dictated with voice recognition software. Similar sounding words can be transcribed inadequately or may not  be corrected upon review.  11/06/2017, 5:39 PM

## 2017-11-15 ENCOUNTER — Other Ambulatory Visit: Payer: Self-pay | Admitting: "Endocrinology

## 2017-12-14 ENCOUNTER — Other Ambulatory Visit: Payer: Self-pay | Admitting: "Endocrinology

## 2018-02-23 ENCOUNTER — Other Ambulatory Visit: Payer: Self-pay | Admitting: "Endocrinology

## 2018-03-22 ENCOUNTER — Other Ambulatory Visit: Payer: Self-pay | Admitting: "Endocrinology

## 2018-05-07 ENCOUNTER — Ambulatory Visit: Payer: BLUE CROSS/BLUE SHIELD | Admitting: "Endocrinology

## 2018-06-18 ENCOUNTER — Other Ambulatory Visit: Payer: Self-pay | Admitting: "Endocrinology

## 2018-06-19 ENCOUNTER — Other Ambulatory Visit: Payer: Self-pay | Admitting: "Endocrinology

## 2018-09-13 ENCOUNTER — Other Ambulatory Visit: Payer: Self-pay | Admitting: "Endocrinology
